# Patient Record
Sex: Female | Born: 1990
Health system: Southern US, Community
[De-identification: ages and names within clinical notes are randomized; demographics above are authoritative.]

## PROBLEM LIST (undated history)

## (undated) DIAGNOSIS — Z8489 Family history of other specified conditions: Secondary | ICD-10-CM

## (undated) HISTORY — PX: WISDOM TOOTH EXTRACTION: SHX21

---

## 2019-07-25 ENCOUNTER — Other Ambulatory Visit: Payer: Self-pay

## 2019-07-25 ENCOUNTER — Encounter (HOSPITAL_COMMUNITY): Payer: Self-pay | Admitting: Emergency Medicine

## 2019-07-25 ENCOUNTER — Observation Stay (HOSPITAL_COMMUNITY)
Admission: EM | Admit: 2019-07-25 | Discharge: 2019-07-26 | Disposition: A | Discharge status: Home / Self Care | Payer: BC Managed Care – PPO | Attending: Internal Medicine | Admitting: Internal Medicine

## 2019-07-25 ENCOUNTER — Emergency Department (HOSPITAL_COMMUNITY): Payer: BC Managed Care – PPO

## 2019-07-25 DIAGNOSIS — Z7982 Long term (current) use of aspirin: Secondary | ICD-10-CM | POA: Insufficient documentation

## 2019-07-25 DIAGNOSIS — R079 Chest pain, unspecified: Secondary | ICD-10-CM | POA: Diagnosis not present

## 2019-07-25 DIAGNOSIS — R0789 Other chest pain: Secondary | ICD-10-CM | POA: Diagnosis not present

## 2019-07-25 DIAGNOSIS — Z79899 Other long term (current) drug therapy: Secondary | ICD-10-CM | POA: Insufficient documentation

## 2019-07-25 DIAGNOSIS — R7989 Other specified abnormal findings of blood chemistry: Secondary | ICD-10-CM | POA: Diagnosis not present

## 2019-07-25 DIAGNOSIS — Z1159 Encounter for screening for other viral diseases: Secondary | ICD-10-CM | POA: Insufficient documentation

## 2019-07-25 DIAGNOSIS — R778 Other specified abnormalities of plasma proteins: Secondary | ICD-10-CM | POA: Diagnosis present

## 2019-07-25 DIAGNOSIS — N8301 Follicular cyst of right ovary: Secondary | ICD-10-CM | POA: Diagnosis not present

## 2019-07-25 DIAGNOSIS — R030 Elevated blood-pressure reading, without diagnosis of hypertension: Secondary | ICD-10-CM | POA: Insufficient documentation

## 2019-07-25 DIAGNOSIS — Z881 Allergy status to other antibiotic agents status: Secondary | ICD-10-CM | POA: Diagnosis not present

## 2019-07-25 DIAGNOSIS — Z20828 Contact with and (suspected) exposure to other viral communicable diseases: Secondary | ICD-10-CM | POA: Diagnosis not present

## 2019-07-25 HISTORY — DX: Family history of other specified conditions: Z84.89

## 2019-07-25 LAB — BASIC METABOLIC PANEL
Anion gap: 18 — ABNORMAL HIGH (ref 5–15)
BUN: 9 mg/dL (ref 6–20)
CO2: 12 mmol/L — ABNORMAL LOW (ref 22–32)
Calcium: 9.1 mg/dL (ref 8.9–10.3)
Chloride: 106 mmol/L (ref 98–111)
Creatinine, Ser: 0.91 mg/dL (ref 0.44–1.00)
GFR calc Af Amer: >60 mL/min (ref >60)
GFR calc non Af Amer: >60 mL/min (ref >60)
Glucose, Bld: 101 mg/dL — ABNORMAL HIGH (ref 70–99)
Potassium: 3.6 mmol/L (ref 3.5–5.1)
Sodium: 136 mmol/L (ref 135–145)

## 2019-07-25 LAB — CBC
HCT: 44.9 % (ref 36.0–46.0)
Hemoglobin: 14.5 g/dL (ref 12.0–15.0)
MCH: 28 pg (ref 26.0–34.0)
MCHC: 32.3 g/dL (ref 30.0–36.0)
MCV: 86.7 fL (ref 80.0–100.0)
Platelets: 419 10*3/uL — ABNORMAL HIGH (ref 150–400)
RBC: 5.18 MIL/uL — ABNORMAL HIGH (ref 3.87–5.11)
RDW: 13 % (ref 11.5–15.5)
WBC: 8.3 10*3/uL (ref 4.0–10.5)
nRBC: 0 % (ref 0.0–0.2)

## 2019-07-25 LAB — TROPONIN I (HIGH SENSITIVITY): Troponin I (High Sensitivity): 12 ng/L (ref <18)

## 2019-07-25 LAB — I-STAT BETA HCG BLOOD, ED (MC, WL, AP ONLY): I-stat hCG, quantitative: <5 m[IU]/mL (ref <5)

## 2019-07-25 MED ORDER — SODIUM CHLORIDE 0.9% FLUSH
3.0000 mL | Freq: Once | INTRAVENOUS | Status: AC
  Administered 2019-07-26: 3 mL via INTRAVENOUS

## 2019-07-25 NOTE — ED Notes (Signed)
[phone] pts Mother wants to know if she needs to come in tonight, aware pt is still in the lobby

## 2019-07-25 NOTE — ED Notes (Signed)
[phone] pts Mother wants to know if she needs to come in tonight, aware pt is still in the lobby  pts mother lives 9 hours away --please call

## 2019-07-25 NOTE — ED Triage Notes (Signed)
Pt states just about 20 minutes ago she stood up from working Production assistant, radio and felt chest pain radiating into her upper back also felt nauseous. Pt denies any cardiac hx of known family hx.

## 2019-07-25 NOTE — ED Notes (Signed)
Pt is very tearful and having a lot of anxiety in triage will recheck BP when calm.

## 2019-07-26 ENCOUNTER — Observation Stay (HOSPITAL_COMMUNITY): Payer: BC Managed Care – PPO

## 2019-07-26 ENCOUNTER — Ambulatory Visit (HOSPITAL_COMMUNITY)
Admission: EM | Disposition: A | Discharge status: Home / Self Care | Payer: Self-pay | Source: Home / Self Care | Attending: Emergency Medicine

## 2019-07-26 ENCOUNTER — Other Ambulatory Visit: Payer: Self-pay

## 2019-07-26 ENCOUNTER — Encounter (HOSPITAL_COMMUNITY): Payer: Self-pay | Admitting: Emergency Medicine

## 2019-07-26 ENCOUNTER — Observation Stay (HOSPITAL_BASED_OUTPATIENT_CLINIC_OR_DEPARTMENT_OTHER): Payer: BC Managed Care – PPO

## 2019-07-26 ENCOUNTER — Emergency Department (HOSPITAL_COMMUNITY): Payer: BC Managed Care – PPO

## 2019-07-26 DIAGNOSIS — R7989 Other specified abnormal findings of blood chemistry: Secondary | ICD-10-CM | POA: Diagnosis not present

## 2019-07-26 DIAGNOSIS — N8301 Follicular cyst of right ovary: Secondary | ICD-10-CM | POA: Diagnosis not present

## 2019-07-26 DIAGNOSIS — R079 Chest pain, unspecified: Secondary | ICD-10-CM

## 2019-07-26 DIAGNOSIS — R778 Other specified abnormalities of plasma proteins: Secondary | ICD-10-CM

## 2019-07-26 HISTORY — DX: Other specified abnormalities of plasma proteins: R77.8

## 2019-07-26 HISTORY — PX: LEFT HEART CATH AND CORONARY ANGIOGRAPHY: CATH118249

## 2019-07-26 HISTORY — PX: CARDIAC CATHETERIZATION: SHX172

## 2019-07-26 HISTORY — DX: Other specified abnormal findings of blood chemistry: R79.89

## 2019-07-26 LAB — CBC WITH DIFFERENTIAL/PLATELET
Abs Immature Granulocytes: 0.05 10*3/uL (ref 0.00–0.07)
Basophils Absolute: 0.1 10*3/uL (ref 0.0–0.1)
Basophils Relative: 0 %
Eosinophils Absolute: 0 10*3/uL (ref 0.0–0.5)
Eosinophils Relative: 0 %
HCT: 40.9 % (ref 36.0–46.0)
Hemoglobin: 13.5 g/dL (ref 12.0–15.0)
Immature Granulocytes: 0 %
Lymphocytes Relative: 15 %
Lymphs Abs: 2 10*3/uL (ref 0.7–4.0)
MCH: 28.1 pg (ref 26.0–34.0)
MCHC: 33 g/dL (ref 30.0–36.0)
MCV: 85 fL (ref 80.0–100.0)
Monocytes Absolute: 0.8 10*3/uL (ref 0.1–1.0)
Monocytes Relative: 7 %
Neutro Abs: 10 10*3/uL — ABNORMAL HIGH (ref 1.7–7.7)
Neutrophils Relative %: 78 %
Platelets: 334 10*3/uL (ref 150–400)
RBC: 4.81 MIL/uL (ref 3.87–5.11)
RDW: 13.2 % (ref 11.5–15.5)
WBC: 12.9 10*3/uL — ABNORMAL HIGH (ref 4.0–10.5)
nRBC: 0 % (ref 0.0–0.2)

## 2019-07-26 LAB — COMPREHENSIVE METABOLIC PANEL
ALT: 22 U/L (ref 0–44)
AST: 25 U/L (ref 15–41)
Albumin: 3.4 g/dL — ABNORMAL LOW (ref 3.5–5.0)
Alkaline Phosphatase: 63 U/L (ref 38–126)
Anion gap: 12 (ref 5–15)
BUN: 6 mg/dL (ref 6–20)
CO2: 18 mmol/L — ABNORMAL LOW (ref 22–32)
Calcium: 8.4 mg/dL — ABNORMAL LOW (ref 8.9–10.3)
Chloride: 105 mmol/L (ref 98–111)
Creatinine, Ser: 0.81 mg/dL (ref 0.44–1.00)
GFR calc Af Amer: >60 mL/min (ref >60)
GFR calc non Af Amer: >60 mL/min (ref >60)
Glucose, Bld: 92 mg/dL (ref 70–99)
Potassium: 3.6 mmol/L (ref 3.5–5.1)
Sodium: 135 mmol/L (ref 135–145)
Total Bilirubin: 0.4 mg/dL (ref 0.3–1.2)
Total Protein: 7 g/dL (ref 6.5–8.1)

## 2019-07-26 LAB — TROPONIN I (HIGH SENSITIVITY)
Troponin I (High Sensitivity): 734 ng/L (ref <18)
Troponin I (High Sensitivity): 1086 ng/L (ref <18)
Troponin I (High Sensitivity): 691 ng/L (ref <18)

## 2019-07-26 LAB — SARS CORONAVIRUS 2 BY RT PCR (HOSPITAL ORDER, PERFORMED IN ~~LOC~~ HOSPITAL LAB): SARS Coronavirus 2: NEGATIVE

## 2019-07-26 LAB — SEDIMENTATION RATE: Sed Rate: 20 mm/hr (ref 0–22)

## 2019-07-26 LAB — HIV ANTIBODY (ROUTINE TESTING W REFLEX): HIV Screen 4th Generation wRfx: NONREACTIVE

## 2019-07-26 LAB — C-REACTIVE PROTEIN: CRP: 1.1 mg/dL — ABNORMAL HIGH (ref <1.0)

## 2019-07-26 LAB — D-DIMER, QUANTITATIVE: D-Dimer, Quant: 0.44 ug/mL-FEU (ref 0.00–0.50)

## 2019-07-26 SURGERY — LEFT HEART CATH AND CORONARY ANGIOGRAPHY
Anesthesia: LOCAL

## 2019-07-26 MED ORDER — ACETAMINOPHEN 650 MG RE SUPP: 650.0000 mg | Freq: Four times a day (QID) | RECTAL | Status: DC | PRN

## 2019-07-26 MED ORDER — FENTANYL CITRATE (PF) 100 MCG/2ML IJ SOLN
INTRAMUSCULAR | Status: AC
  Filled 2019-07-26: qty 2

## 2019-07-26 MED ORDER — VERAPAMIL HCL 2.5 MG/ML IV SOLN
INTRAVENOUS | Status: AC
  Filled 2019-07-26: qty 2

## 2019-07-26 MED ORDER — NITROGLYCERIN 0.4 MG SL SUBL: 0.4000 mg | SUBLINGUAL_TABLET | SUBLINGUAL | Status: DC | PRN

## 2019-07-26 MED ORDER — ASPIRIN 81 MG PO TBEC: 81.0000 mg | DELAYED_RELEASE_TABLET | Freq: Every day | ORAL | 0 refills | Status: AC

## 2019-07-26 MED ORDER — METOPROLOL TARTRATE 25 MG PO TABS
25.0000 mg | ORAL_TABLET | Freq: Two times a day (BID) | ORAL | Status: DC
  Administered 2019-07-26: 25 mg via ORAL
  Filled 2019-07-26: qty 1

## 2019-07-26 MED ORDER — ACETAMINOPHEN 325 MG PO TABS: 650.0000 mg | ORAL_TABLET | Freq: Four times a day (QID) | ORAL | Status: DC | PRN

## 2019-07-26 MED ORDER — ONDANSETRON HCL 4 MG/2ML IJ SOLN: 4.0000 mg | Freq: Four times a day (QID) | INTRAMUSCULAR | Status: DC | PRN

## 2019-07-26 MED ORDER — NITROGLYCERIN 0.4 MG SL SUBL: 0.4000 mg | SUBLINGUAL_TABLET | SUBLINGUAL | 0 refills | Status: AC | PRN

## 2019-07-26 MED ORDER — ASPIRIN EC 81 MG PO TBEC
81.0000 mg | DELAYED_RELEASE_TABLET | Freq: Every day | ORAL | Status: DC
  Administered 2019-07-26: 81 mg via ORAL
  Filled 2019-07-26: qty 1

## 2019-07-26 MED ORDER — SODIUM CHLORIDE 0.9% FLUSH: 3.0000 mL | Freq: Two times a day (BID) | INTRAVENOUS | Status: DC

## 2019-07-26 MED ORDER — SODIUM CHLORIDE 0.9% FLUSH: 3.0000 mL | INTRAVENOUS | Status: DC | PRN

## 2019-07-26 MED ORDER — LIDOCAINE HCL (PF) 1 % IJ SOLN
INTRAMUSCULAR | Status: AC
  Filled 2019-07-26: qty 30

## 2019-07-26 MED ORDER — ONDANSETRON HCL 4 MG PO TABS: 4.0000 mg | ORAL_TABLET | Freq: Four times a day (QID) | ORAL | 0 refills | Status: DC | PRN

## 2019-07-26 MED ORDER — MIDAZOLAM HCL 2 MG/2ML IJ SOLN
INTRAMUSCULAR | Status: DC | PRN
  Administered 2019-07-26 (×2): 1 mg via INTRAVENOUS
  Administered 2019-07-26: 2 mg via INTRAVENOUS

## 2019-07-26 MED ORDER — HEPARIN (PORCINE) IN NACL 1000-0.9 UT/500ML-% IV SOLN
INTRAVENOUS | Status: AC
  Filled 2019-07-26: qty 500

## 2019-07-26 MED ORDER — IOHEXOL 350 MG/ML SOLN
INTRAVENOUS | Status: DC | PRN
  Administered 2019-07-26: 50 mL via INTRA_ARTERIAL

## 2019-07-26 MED ORDER — FENTANYL CITRATE (PF) 100 MCG/2ML IJ SOLN
INTRAMUSCULAR | Status: DC | PRN
  Administered 2019-07-26 (×3): 25 ug via INTRAVENOUS

## 2019-07-26 MED ORDER — MORPHINE SULFATE (PF) 2 MG/ML IV SOLN: 2.0000 mg | INTRAVENOUS | Status: DC | PRN

## 2019-07-26 MED ORDER — SODIUM CHLORIDE 0.9 % WEIGHT BASED INFUSION: 1.0000 mL/kg/h | INTRAVENOUS | Status: DC

## 2019-07-26 MED ORDER — HEPARIN SODIUM (PORCINE) 1000 UNIT/ML IJ SOLN
INTRAMUSCULAR | Status: AC
  Filled 2019-07-26: qty 1

## 2019-07-26 MED ORDER — VERAPAMIL HCL 2.5 MG/ML IV SOLN
INTRAVENOUS | Status: DC | PRN
  Administered 2019-07-26: 10 mL via INTRA_ARTERIAL

## 2019-07-26 MED ORDER — SODIUM CHLORIDE 0.9 % IV SOLN: 250.0000 mL | INTRAVENOUS | Status: DC | PRN

## 2019-07-26 MED ORDER — HEPARIN (PORCINE) 25000 UT/250ML-% IV SOLN: 1000.0000 [IU]/h | INTRAVENOUS | Status: DC

## 2019-07-26 MED ORDER — ONDANSETRON HCL 4 MG PO TABS: 4.0000 mg | ORAL_TABLET | Freq: Four times a day (QID) | ORAL | Status: DC | PRN

## 2019-07-26 MED ORDER — HEPARIN (PORCINE) IN NACL 1000-0.9 UT/500ML-% IV SOLN
INTRAVENOUS | Status: AC
  Filled 2019-07-26: qty 1000

## 2019-07-26 MED ORDER — HEPARIN BOLUS VIA INFUSION
4000.0000 [IU] | Freq: Once | INTRAVENOUS | Status: DC
  Filled 2019-07-26: qty 4000

## 2019-07-26 MED ORDER — LIDOCAINE HCL (PF) 1 % IJ SOLN
INTRAMUSCULAR | Status: DC | PRN
  Administered 2019-07-26: 2 mL
  Administered 2019-07-26: 20 mL

## 2019-07-26 MED ORDER — HEPARIN (PORCINE) IN NACL 1000-0.9 UT/500ML-% IV SOLN
INTRAVENOUS | Status: DC | PRN
  Administered 2019-07-26 (×3): 500 mL

## 2019-07-26 MED ORDER — MIDAZOLAM HCL 2 MG/2ML IJ SOLN
INTRAMUSCULAR | Status: AC
  Filled 2019-07-26: qty 2

## 2019-07-26 MED ORDER — IOHEXOL 350 MG/ML SOLN
100.0000 mL | Freq: Once | INTRAVENOUS | Status: AC | PRN
  Administered 2019-07-26: 100 mL via INTRAVENOUS

## 2019-07-26 MED ORDER — SODIUM CHLORIDE 0.9 % WEIGHT BASED INFUSION: 3.0000 mL/kg/h | INTRAVENOUS | Status: DC

## 2019-07-26 SURGICAL SUPPLY — 14 items
CATH 5FR JL3.5 JR4 ANG PIG MP (CATHETERS) ×2 IMPLANT
CLOSURE MYNX CONTROL 5F (Vascular Products) ×2 IMPLANT
DEVICE RAD COMP TR BAND LRG (VASCULAR PRODUCTS) ×2 IMPLANT
GLIDESHEATH SLEND SS 6F .021 (SHEATH) ×2 IMPLANT
GUIDEWIRE INQWIRE 1.5J.035X260 (WIRE) ×1 IMPLANT
INQWIRE 1.5J .035X260CM (WIRE) ×2
KIT HEART LEFT (KITS) ×2 IMPLANT
PACK CARDIAC CATHETERIZATION (CUSTOM PROCEDURE TRAY) ×2 IMPLANT
SHEATH PINNACLE 5F 10CM (SHEATH) ×2 IMPLANT
SHEATH PROBE COVER 6X72 (BAG) ×2 IMPLANT
TRANSDUCER W/STOPCOCK (MISCELLANEOUS) ×2 IMPLANT
TUBING CIL FLEX 10 FLL-RA (TUBING) ×2 IMPLANT
WIRE EMERALD 3MM-J .035X150CM (WIRE) ×2 IMPLANT
WIRE HI TORQ VERSACORE-J 145CM (WIRE) ×2 IMPLANT

## 2019-07-26 NOTE — Progress Notes (Signed)
Patient ID: Andrea Austin, female   DOB: 1991/12/01, 28 y.o.   MRN: [id number] Patient presented early this morning for chest pain with elevated troponin.  Cardiology has been consulted which is pending.  Patient seen and examined at bedside and plan of care discussed with her.  I have reviewed patient's medical records including this morning's H&P, current vitals, labs and medications myself.

## 2019-07-26 NOTE — H&P (View-Only) (Signed)
Cardiology Consultation:   Patient ID: Andrea Austin MRN: [id number]; DOB: 1991/03/30  Admit date: 07/25/2019 Date of Consult: 07/26/2019  Primary Care Provider: Patient, No Pcp Per Primary Cardiologist:  Taos Tapp  Primary Electrophysiologist:  None    Patient Profile:   Andrea Austin is a 28 y.o. female with no significant PMH  who is being seen today for the evaluation of  Chest pain  at the request of  Dr. Toniann FailKakrakandy.  History of Present Illness:   Andrea Austin is a healthy 28 yo.   She developed ches pressure yesterday evening around 4 PM whille working at her computer.  Pressure,  + diaphoresis.   Became very hot.    Radiated to both shoulder blades,.   Took her BP - 160 / 102   Pain partially resolved,  Then came back. Pain lasted 15 min, partially resolved,  Then the 2nd one lasted 1 hour.  Pain is worse lying down  Not worse with deep breath.  Is a week before her menstrual period  BP is typically well controlled .  Watches her salt  + fmily hx of CAD - great grandmother - CAD Both grandmothers had CVA Grandfather on months side had a CVA   Walks on occasion.  Has been taking care of her grandmother . Lots of stress  ,   Lives with boyfriend. 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 No pertinent past medical history.  History reviewed. No pertinent surgical history.   Home Medications:  Prior to Admission medications   Medication Sig Start Date End Date Taking? Authorizing Provider  cetirizine (ZYRTEC) 10 MG tablet Take 10 mg by mouth daily.   Yes [provider]  SPRINTEC 28 0.25-35 MG-MCG tablet Take 1 tablet by mouth daily. 06/07/19  Yes [provider]    Inpatient Medications: Scheduled Meds:  aspirin EC  81 mg Oral Daily   metoprolol tartrate  25 mg Oral BID   Continuous Infusions:  PRN  Meds: acetaminophen **OR** acetaminophen, ondansetron **OR** ondansetron (ZOFRAN) IV  Allergies:    Allergies  Allergen Reactions   Ceclor [Cefaclor] Other (See Comments)    shaking   Levofloxacin Other (See Comments)    Ankles and tendens feel weird    Social History:   Social History   Socioeconomic History   Marital status: Single    Spouse name: Not on file   Number of children: Not on file   Years of education: Not on file   Highest education level: Not on file  Occupational History   Not on file  Social Needs   Financial resource strain: Not on file   Food insecurity    Worry: Not on file    Inability: Not on file   Transportation needs    Medical: Not on file    Non-medical: Not on file  Tobacco Use   Smoking status: Never Smoker   Smokeless tobacco: Never Used  Substance and Sexual Activity   Alcohol use: Yes   Drug use: Not on file   Sexual activity: Not on file  Lifestyle   Physical activity    Days per week: Not on file    Minutes per session: Not on file   Stress: Not on file  Relationships   Social connections    Talks on  phone: Not on file    Gets together: Not on file    Attends religious service: Not on file    Active member of club or organization: Not on file    Attends meetings of clubs or organizations: Not on file    Relationship status: Not on file   Intimate partner violence    Fear of current or ex partner: Not on file    Emotionally abused: Not on file    Physically abused: Not on file    Forced sexual activity: Not on file  Other Topics Concern   Not on file  Social History Narrative   Not on file    Family History:    Family History  Problem Relation Age of Onset   CAD Maternal Grandmother    Stroke Maternal Grandmother    Diabetes Mellitus II Maternal Grandfather      ROS:  Please see the history of present illness.   All other ROS reviewed and negative.     Physical Exam/Data:   Vitals:    07/26/19 0830 07/26/19 0900 07/26/19 0930 07/26/19 1000  BP: (!) 144/89 (!) 148/91 (!) 156/94 (!) 162/110  Pulse: 96 91 (!) 111 (!) 124  Resp: 19 16 19  (!) 23  Temp:      TempSrc:      SpO2: 98% 97% 99% 99%  Weight:       No intake or output data in the 24 hours ending 07/26/19 1027 Last 3 Weights 07/26/2019  Weight (lbs) 180 lb  Weight (kg) 81.647 kg     There is no height or weight on file to calculate BMI.  General:  Well nourished, well developed, in no acute distress HEENT: normal Lymph: no adenopathy Neck: no JVD Endocrine:  No thryomegaly Vascular: No carotid bruits; FA pulses 2+ bilaterally without bruits  Cardiac:  normal S1, S2; RRR;  Soft systolic murmur  Lungs:  clear to auscultation bilaterally, no wheezing, rhonchi or rales  Abd: soft, nontender, no hepatomegaly  Ext: no edema Musculoskeletal:  No deformities, BUE and BLE strength normal and equal Skin: warm and dry  Neuro:  CNs 2-12 intact, no focal abnormalities noted Psych:  Normal affect   EKG:   Initial ECG showed mild ST depression in the inf. Leads  2nd ECG shows sinus tach with no ST  Abn.   Telemetry:  Telemetry was personally reviewed and demonstrates:  Sinus tach   Relevant CV Studies:   Laboratory Data:  High Sensitivity Troponin:   Recent Labs  Lab 07/25/19 1708 07/26/19 0102 07/26/19 0222  TROPONINIHS 12 734* 1,086*     Cardiac EnzymesNo results for input(s): TROPONINI in the last 168 hours. No results for input(s): TROPIPOC in the last 168 hours.  Chemistry Recent Labs  Lab 07/25/19 1708 07/26/19 0439  NA 136 135  K 3.6 3.6  CL 106 105  CO2 12* 18*  GLUCOSE 101* 92  BUN 9 6  CREATININE 0.91 0.81  CALCIUM 9.1 8.4*  GFRNONAA >60 >60  GFRAA >60 >60  ANIONGAP 18* 12    Recent Labs  Lab 07/26/19 0439  PROT 7.0  ALBUMIN 3.4*  AST 25  ALT 22  ALKPHOS 63  BILITOT 0.4   Hematology Recent Labs  Lab 07/25/19 1708 07/26/19 0439  WBC 8.3 12.9*  RBC 5.18* 4.81  HGB  14.5 13.5  HCT 44.9 40.9  MCV 86.7 85.0  MCH 28.0 28.1  MCHC 32.3 33.0  RDW 13.0 13.2  PLT 419* 334  BNPNo results for input(s): BNP, PROBNP in the last 168 hours.  DDimer  Recent Labs  Lab 07/26/19 0101  DDIMER 0.44     Radiology/Studies:  Dg Chest 2 View  Result Date: 07/25/2019 CLINICAL DATA:  Chest pain EXAM: CHEST - 2 VIEW COMPARISON:  None. FINDINGS: Lungs are clear. Heart size and pulmonary vascularity are normal. No adenopathy. No pneumothorax. No bone lesions. IMPRESSION: No edema or consolidation. Electronically Signed   By: Bretta BangWilliam  Woodruff III M.D.   On: 07/25/2019 17:24   Ct Angio Chest/abd/pel For Dissection W And/or Wo Contrast  Result Date: 07/26/2019 CLINICAL DATA:  Chest, back pain acute, aortic dissection suspected EXAM: CT ANGIOGRAPHY CHEST, ABDOMEN AND PELVIS TECHNIQUE: Multidetector CT imaging through the chest, abdomen and pelvis was performed using the standard protocol during bolus administration of intravenous contrast. Multiplanar reconstructed images and MIPs were obtained and reviewed to evaluate the vascular anatomy. CONTRAST:  100mL OMNIPAQUE IOHEXOL 350 MG/ML SOLN COMPARISON:  Chest radiograph July 25, 2019 FINDINGS: CTA CHEST FINDINGS Cardiovascular: No abnormal aortic wall thickening or features to suggest intramural hematoma. Satisfactory preferential opacification of the aorta The aorta is normal caliber. No intramural hematoma, dissection flap or other luminal abnormality of the aorta is seen. No periaortic stranding or hemorrhage. Normal branching of the great vessels. Normal heart size. No pericardial effusion. Limited evaluation of the central pulmonary arteries reveals no central or large proximal segmental pulmonary embolus. Normal pulmonary artery caliber. Mediastinum/Nodes: No enlarged mediastinal or axillary lymph nodes. Thyroid gland, trachea, and esophagus demonstrate no significant findings. Lungs/Pleura: No consolidation, features of edema,  pneumothorax, or effusion. Musculoskeletal: No chest wall abnormality. No acute or significant osseous findings. Review of the MIP images confirms the above findings. CTA ABDOMEN AND PELVIS FINDINGS VASCULAR Aorta: Normal caliber aorta without aneurysm, dissection, vasculitis or significant stenosis. Celiac: Patent without evidence of aneurysm, dissection, vasculitis or significant stenosis. SMA: Patent without evidence of aneurysm, dissection, vasculitis or significant stenosis. Renals: Singleton renal arteries bilaterally. Both renal arteries are patent without evidence of aneurysm, dissection, vasculitis, fibromuscular dysplasia or significant stenosis. IMA: Patent without evidence of aneurysm, dissection, vasculitis or significant stenosis. Inflow: Patent without evidence of aneurysm, dissection, vasculitis or significant stenosis. Veins: No obvious venous abnormality within the limitations of this arterial phase study. Review of the MIP images confirms the above findings. NON-VASCULAR Hepatobiliary: No focal liver abnormality is seen. No gallstones, gallbladder wall thickening, or biliary dilatation. Pancreas: Unremarkable. No pancreatic ductal dilatation or surrounding inflammatory changes. Spleen: Normal in size without focal abnormality. Adrenals/Urinary Tract: Adrenal glands are unremarkable. Early excretion of contrast medium partially opacifies the collecting systems. Kidneys are otherwise unremarkable, without renal calculi, focal lesion, or hydronephrosis. Bladder is unremarkable. Stomach/Bowel: Distal esophagus, stomach and duodenal sweep are unremarkable. No bowel wall thickening or dilatation. No evidence of obstruction. A normal appendix is visualized. Lymphatic: No suspicious or enlarged lymph nodes in the included lymphatic chains. Reproductive: Normal anteverted uterus. Dominant 3.2 cm follicle in the right ovary. No concerning adnexal lesions. Other: No abdominopelvic free fluid or free gas. No  bowel containing hernias. Musculoskeletal: Subchondral vacuum phenomenon involving the left ilium. Bilateral, symmetric sclerosis involving the iliac sides of both SI joints compatible with osteitis condensans ilii. No acute osseous abnormality or suspicious osseous lesion. Benign bone island in the proximal right femur. Review of the MIP images confirms the above findings. IMPRESSION: No evidence of acute aortic syndrome. No acute intrathoracic or intra-abdominal process. Electronically Signed   By: Coralie KeensPrice  DeHay M.D.  On: 07/26/2019 03:49    Assessment and Plan:   1. Acute coronary syndrome.    CP is worrisome for ACS. troponins are significant    12, 734, 1086  She is currently pain free.  Echo is being done  Im concerned about SCAD  - could also have takotsubo syndrome UDS is ordered   Discussed doing a coronary CT angio with dr. Delton SeeNelson. Our best CT scanner is currently being upgraded. I do not think the older CT scanner will help with SCAD diagnosis.  I think we should consider cardiac cath .        For questions or updates, please contact [facility] Please consult [email or web address] for contact info under     Signed, Kristeen MissPhilip Tricia [place], MD  07/26/2019 10:27 AM

## 2019-07-26 NOTE — Progress Notes (Signed)
    Cath was normal Do not have an explanation for her mild ST changes in the inferior leads and the troponin levels.  ? Spasm. Will DC on ASA 81 mg a day  NTG 0.4 mg SL PRN chest pain  Will Dc the metoprolol I started her on this am  Will have her follow up with [place] in 3-4 weeks. ( me or APP ) and I will see her in 2 months .  She knows the restrictions for taking care of her cath sites.     Mertie Moores, MD  07/26/2019 3:27 PM    [facility] [address],  [place], [place]  [address] Pager [phone] Phone: [phone]; Fax: [phone]

## 2019-07-26 NOTE — ED Notes (Signed)
Pt transported to cath lab.  

## 2019-07-26 NOTE — Consult Note (Signed)
Cardiology Consultation:   Patient ID: Andrea Austin MRN: [id number]; DOB: 1991/03/30  Admit date: 07/25/2019 Date of Consult: 07/26/2019  Primary Care Provider: Patient, No Pcp Per Primary Cardiologist:  Haelie Clapp  Primary Electrophysiologist:  None    Patient Profile:   Andrea Austin is a 28 y.o. female with no significant PMH  who is being seen today for the evaluation of  Chest pain  at the request of  Dr. Toniann FailKakrakandy.  History of Present Illness:   Ms. Andrea Austin is a healthy 28 yo.   She developed ches pressure yesterday evening around 4 PM whille working at her computer.  Pressure,  + diaphoresis.   Became very hot.    Radiated to both shoulder blades,.   Took her BP - 160 / 102   Pain partially resolved,  Then came back. Pain lasted 15 min, partially resolved,  Then the 2nd one lasted 1 hour.  Pain is worse lying down  Not worse with deep breath.  Is a week before her menstrual period  BP is typically well controlled .  Watches her salt  + fmily hx of CAD - great grandmother - CAD Both grandmothers had CVA Grandfather on months side had a CVA   Walks on occasion.  Has been taking care of her grandmother . Lots of stress  ,   Lives with boyfriend. 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 No pertinent past medical history.  History reviewed. No pertinent surgical history.   Home Medications:  Prior to Admission medications   Medication Sig Start Date End Date Taking? Authorizing Provider  cetirizine (ZYRTEC) 10 MG tablet Take 10 mg by mouth daily.   Yes [provider]  SPRINTEC 28 0.25-35 MG-MCG tablet Take 1 tablet by mouth daily. 06/07/19  Yes [provider]    Inpatient Medications: Scheduled Meds:  aspirin EC  81 mg Oral Daily   metoprolol tartrate  25 mg Oral BID   Continuous Infusions:  PRN  Meds: acetaminophen **OR** acetaminophen, ondansetron **OR** ondansetron (ZOFRAN) IV  Allergies:    Allergies  Allergen Reactions   Ceclor [Cefaclor] Other (See Comments)    shaking   Levofloxacin Other (See Comments)    Ankles and tendens feel weird    Social History:   Social History   Socioeconomic History   Marital status: Single    Spouse name: Not on file   Number of children: Not on file   Years of education: Not on file   Highest education level: Not on file  Occupational History   Not on file  Social Needs   Financial resource strain: Not on file   Food insecurity    Worry: Not on file    Inability: Not on file   Transportation needs    Medical: Not on file    Non-medical: Not on file  Tobacco Use   Smoking status: Never Smoker   Smokeless tobacco: Never Used  Substance and Sexual Activity   Alcohol use: Yes   Drug use: Not on file   Sexual activity: Not on file  Lifestyle   Physical activity    Days per week: Not on file    Minutes per session: Not on file   Stress: Not on file  Relationships   Social connections    Talks on  phone: Not on file    Gets together: Not on file    Attends religious service: Not on file    Active member of club or organization: Not on file    Attends meetings of clubs or organizations: Not on file    Relationship status: Not on file   Intimate partner violence    Fear of current or ex partner: Not on file    Emotionally abused: Not on file    Physically abused: Not on file    Forced sexual activity: Not on file  Other Topics Concern   Not on file  Social History Narrative   Not on file    Family History:    Family History  Problem Relation Age of Onset   CAD Maternal Grandmother    Stroke Maternal Grandmother    Diabetes Mellitus II Maternal Grandfather      ROS:  Please see the history of present illness.   All other ROS reviewed and negative.     Physical Exam/Data:   Vitals:    07/26/19 0830 07/26/19 0900 07/26/19 0930 07/26/19 1000  BP: (!) 144/89 (!) 148/91 (!) 156/94 (!) 162/110  Pulse: 96 91 (!) 111 (!) 124  Resp: 19 16 19  (!) 23  Temp:      TempSrc:      SpO2: 98% 97% 99% 99%  Weight:       No intake or output data in the 24 hours ending 07/26/19 1027 Last 3 Weights 07/26/2019  Weight (lbs) 180 lb  Weight (kg) 81.647 kg     There is no height or weight on file to calculate BMI.  General:  Well nourished, well developed, in no acute distress HEENT: normal Lymph: no adenopathy Neck: no JVD Endocrine:  No thryomegaly Vascular: No carotid bruits; FA pulses 2+ bilaterally without bruits  Cardiac:  normal S1, S2; RRR;  Soft systolic murmur  Lungs:  clear to auscultation bilaterally, no wheezing, rhonchi or rales  Abd: soft, nontender, no hepatomegaly  Ext: no edema Musculoskeletal:  No deformities, BUE and BLE strength normal and equal Skin: warm and dry  Neuro:  CNs 2-12 intact, no focal abnormalities noted Psych:  Normal affect   EKG:   Initial ECG showed mild ST depression in the inf. Leads  2nd ECG shows sinus tach with no ST  Abn.   Telemetry:  Telemetry was personally reviewed and demonstrates:  Sinus tach   Relevant CV Studies:   Laboratory Data:  High Sensitivity Troponin:   Recent Labs  Lab 07/25/19 1708 07/26/19 0102 07/26/19 0222  TROPONINIHS 12 734* 1,086*     Cardiac EnzymesNo results for input(s): TROPONINI in the last 168 hours. No results for input(s): TROPIPOC in the last 168 hours.  Chemistry Recent Labs  Lab 07/25/19 1708 07/26/19 0439  NA 136 135  K 3.6 3.6  CL 106 105  CO2 12* 18*  GLUCOSE 101* 92  BUN 9 6  CREATININE 0.91 0.81  CALCIUM 9.1 8.4*  GFRNONAA >60 >60  GFRAA >60 >60  ANIONGAP 18* 12    Recent Labs  Lab 07/26/19 0439  PROT 7.0  ALBUMIN 3.4*  AST 25  ALT 22  ALKPHOS 63  BILITOT 0.4   Hematology Recent Labs  Lab 07/25/19 1708 07/26/19 0439  WBC 8.3 12.9*  RBC 5.18* 4.81  HGB  14.5 13.5  HCT 44.9 40.9  MCV 86.7 85.0  MCH 28.0 28.1  MCHC 32.3 33.0  RDW 13.0 13.2  PLT 419* 334  BNPNo results for input(s): BNP, PROBNP in the last 168 hours.  °DDimer  °Recent Labs  °Lab 07/26/19 °0101  °DDIMER 0.44  ° ° ° °Radiology/Studies:  °Dg Chest 2 View ° °Result Date: 07/25/2019 °CLINICAL DATA:  Chest pain EXAM: CHEST - 2 VIEW COMPARISON:  None. FINDINGS: Lungs are clear. Heart size and pulmonary vascularity are normal. No adenopathy. No pneumothorax. No bone lesions. IMPRESSION: No edema or consolidation. Electronically Signed   By: William  Woodruff III M.D.   On: 07/25/2019 17:24  ° °Ct Angio Chest/abd/pel For Dissection W And/or Wo Contrast ° °Result Date: 07/26/2019 °CLINICAL DATA:  Chest, back pain acute, aortic dissection suspected EXAM: CT ANGIOGRAPHY CHEST, ABDOMEN AND PELVIS TECHNIQUE: Multidetector CT imaging through the chest, abdomen and pelvis was performed using the standard protocol during bolus administration of intravenous contrast. Multiplanar reconstructed images and MIPs were obtained and reviewed to evaluate the vascular anatomy. CONTRAST:  100mL OMNIPAQUE IOHEXOL 350 MG/ML SOLN COMPARISON:  Chest radiograph July 25, 2019 FINDINGS: CTA CHEST FINDINGS Cardiovascular: No abnormal aortic wall thickening or features to suggest intramural hematoma. Satisfactory preferential opacification of the aorta The aorta is normal caliber. No intramural hematoma, dissection flap or other luminal abnormality of the aorta is seen. No periaortic stranding or hemorrhage. Normal branching of the great vessels. Normal heart size. No pericardial effusion. Limited evaluation of the central pulmonary arteries reveals no central or large proximal segmental pulmonary embolus. Normal pulmonary artery caliber. Mediastinum/Nodes: No enlarged mediastinal or axillary lymph nodes. Thyroid gland, trachea, and esophagus demonstrate no significant findings. Lungs/Pleura: No consolidation, features of edema,  pneumothorax, or effusion. Musculoskeletal: No chest wall abnormality. No acute or significant osseous findings. Review of the MIP images confirms the above findings. CTA ABDOMEN AND PELVIS FINDINGS VASCULAR Aorta: Normal caliber aorta without aneurysm, dissection, vasculitis or significant stenosis. Celiac: Patent without evidence of aneurysm, dissection, vasculitis or significant stenosis. SMA: Patent without evidence of aneurysm, dissection, vasculitis or significant stenosis. Renals: Singleton renal arteries bilaterally. Both renal arteries are patent without evidence of aneurysm, dissection, vasculitis, fibromuscular dysplasia or significant stenosis. IMA: Patent without evidence of aneurysm, dissection, vasculitis or significant stenosis. Inflow: Patent without evidence of aneurysm, dissection, vasculitis or significant stenosis. Veins: No obvious venous abnormality within the limitations of this arterial phase study. Review of the MIP images confirms the above findings. NON-VASCULAR Hepatobiliary: No focal liver abnormality is seen. No gallstones, gallbladder wall thickening, or biliary dilatation. Pancreas: Unremarkable. No pancreatic ductal dilatation or surrounding inflammatory changes. Spleen: Normal in size without focal abnormality. Adrenals/Urinary Tract: Adrenal glands are unremarkable. Early excretion of contrast medium partially opacifies the collecting systems. Kidneys are otherwise unremarkable, without renal calculi, focal lesion, or hydronephrosis. Bladder is unremarkable. Stomach/Bowel: Distal esophagus, stomach and duodenal sweep are unremarkable. No bowel wall thickening or dilatation. No evidence of obstruction. A normal appendix is visualized. Lymphatic: No suspicious or enlarged lymph nodes in the included lymphatic chains. Reproductive: Normal anteverted uterus. Dominant 3.2 cm follicle in the right ovary. No concerning adnexal lesions. Other: No abdominopelvic free fluid or free gas. No  bowel containing hernias. Musculoskeletal: Subchondral vacuum phenomenon involving the left ilium. Bilateral, symmetric sclerosis involving the iliac sides of both SI joints compatible with osteitis condensans ilii. No acute osseous abnormality or suspicious osseous lesion. Benign bone island in the proximal right femur. Review of the MIP images confirms the above findings. IMPRESSION: No evidence of acute aortic syndrome. No acute intrathoracic or intra-abdominal process. Electronically Signed   By: Price  DeHay M.D.     On: 07/26/2019 03:49    Assessment and Plan:   1. Acute coronary syndrome.    CP is worrisome for ACS. troponins are significant    12, 734, 1086  She is currently pain free.  Echo is being done  Im concerned about SCAD  - could also have takotsubo syndrome UDS is ordered   Discussed doing a coronary CT angio with dr. Delton SeeNelson. Our best CT scanner is currently being upgraded. I do not think the older CT scanner will help with SCAD diagnosis.  I think we should consider cardiac cath .        For questions or updates, please contact [facility] Please consult [email or web address] for contact info under     Signed, Kristeen MissPhilip Markees Carns, MD  07/26/2019 10:27 AM

## 2019-07-26 NOTE — H&P (Signed)
History and Physical    Andrea Austin VHQ:[id number] DOB: 1991-06-27 DOA: 07/25/2019  PCP: Patient, No Pcp Per  Patient coming from: Home.  Chief Complaint: Chest pain.  HPI: Andrea Austin is a 28 y.o. female with no significant past medical history presents to the ER after patient started having chest pain.  Patient states that she was working on the computer last evening around 4 PM when she start developing pressure-like chest pain radiating to both shoulder blades when patient became diaphoretic.  The chest pain lasted for 15 minutes.  And resolved for few minutes and came back again for another 5 minutes when patient took some aspirin.  Had some nausea denies any vomiting abdominal pain shortness of breath productive cough fever or chills.  Patient states her chest pain worsened on lying down.  Patient came to the ER given the symptoms.  ED Course: In the ER patient chest pain had resolved.  EKG shows sinus rhythm nonspecific changes.  Initial troponins were negative and subsequent ones came 734 and 1086.  CT dissection study was done which was showing nothing acute.  On-call cardiology was consulted.  Patient admitted for further work-up.  Cardiology recommended 2D echo.  Review of Systems: As per HPI, rest all negative.   History reviewed. No pertinent past medical history.  History reviewed. No pertinent surgical history.   reports that she has never smoked. She has never used smokeless tobacco. She reports current alcohol use. No history on file for drug.  No Known Allergies  Family History  Problem Relation Age of Onset  . CAD Maternal Grandmother   . Stroke Maternal Grandmother   . Diabetes Mellitus II Maternal Grandfather     Prior to Admission medications   Not on File    Physical Exam: Constitutional: Moderately built and nourished. Vitals:   07/26/19 0059 07/26/19 0215 07/26/19 0228 07/26/19 0345  BP:      Pulse: 90 (!) 102  92  Resp: 19 20  18    Temp:      TempSrc:      SpO2: 100% 100%  99%  Weight:   81.6 kg    Eyes: Anicteric no pallor. ENMT: No discharge from the ears eyes nose or mouth. Neck: No mass felt.  No neck rigidity. Respiratory: No rhonchi or crepitations. Cardiovascular: S1-S2 heard. Abdomen: Soft nontender bowel sounds present. Musculoskeletal: No edema. Skin: No rash. Neurologic: Alert awake oriented to time place and person.  Moves all extremities. Psychiatric: Appears normal per normal affect.   Labs on Admission: I have personally reviewed following labs and imaging studies  CBC: Recent Labs  Lab 07/25/19 1708  WBC 8.3  HGB 14.5  HCT 44.9  MCV 86.7  PLT 419*   Basic Metabolic Panel: Recent Labs  Lab 07/25/19 1708  NA 136  K 3.6  CL 106  CO2 12*  GLUCOSE 101*  BUN 9  CREATININE 0.91  CALCIUM 9.1   GFR: CrCl cannot be calculated (Unknown ideal weight.). Liver Function Tests: No results for input(s): AST, ALT, ALKPHOS, BILITOT, PROT, ALBUMIN in the last 168 hours. No results for input(s): LIPASE, AMYLASE in the last 168 hours. No results for input(s): AMMONIA in the last 168 hours. Coagulation Profile: No results for input(s): INR, PROTIME in the last 168 hours. Cardiac Enzymes: No results for input(s): CKTOTAL, CKMB, CKMBINDEX, TROPONINI in the last 168 hours. BNP (last 3 results) No results for input(s): PROBNP in the last 8760 hours. HbA1C: No results for  input(s): HGBA1C in the last 72 hours. CBG: No results for input(s): GLUCAP in the last 168 hours. Lipid Profile: No results for input(s): CHOL, HDL, LDLCALC, TRIG, CHOLHDL, LDLDIRECT in the last 72 hours. Thyroid Function Tests: No results for input(s): TSH, T4TOTAL, FREET4, T3FREE, THYROIDAB in the last 72 hours. Anemia Panel: No results for input(s): VITAMINB12, FOLATE, FERRITIN, TIBC, IRON, RETICCTPCT in the last 72 hours. Urine analysis: No results found for: COLORURINE, APPEARANCEUR, LABSPEC, PHURINE, GLUCOSEU,  HGBUR, BILIRUBINUR, KETONESUR, PROTEINUR, UROBILINOGEN, NITRITE, LEUKOCYTESUR Sepsis Labs: @LABRCNTIP (procalcitonin:4,lacticidven:4) )No results found for this or any previous visit (from the past 240 hour(s)).   Radiological Exams on Admission: Dg Chest 2 View  Result Date: 07/25/2019 CLINICAL DATA:  Chest pain EXAM: CHEST - 2 VIEW COMPARISON:  None. FINDINGS: Lungs are clear. Heart size and pulmonary vascularity are normal. No adenopathy. No pneumothorax. No bone lesions. IMPRESSION: No edema or consolidation. Electronically Signed   By: Lowella Grip III M.D.   On: 07/25/2019 17:24   Ct Angio Chest/abd/pel For Dissection W And/or Wo Contrast  Result Date: 07/26/2019 CLINICAL DATA:  Chest, back pain acute, aortic dissection suspected EXAM: CT ANGIOGRAPHY CHEST, ABDOMEN AND PELVIS TECHNIQUE: Multidetector CT imaging through the chest, abdomen and pelvis was performed using the standard protocol during bolus administration of intravenous contrast. Multiplanar reconstructed images and MIPs were obtained and reviewed to evaluate the vascular anatomy. CONTRAST:  186mL OMNIPAQUE IOHEXOL 350 MG/ML SOLN COMPARISON:  Chest radiograph July 25, 2019 FINDINGS: CTA CHEST FINDINGS Cardiovascular: No abnormal aortic wall thickening or features to suggest intramural hematoma. Satisfactory preferential opacification of the aorta The aorta is normal caliber. No intramural hematoma, dissection flap or other luminal abnormality of the aorta is seen. No periaortic stranding or hemorrhage. Normal branching of the great vessels. Normal heart size. No pericardial effusion. Limited evaluation of the central pulmonary arteries reveals no central or large proximal segmental pulmonary embolus. Normal pulmonary artery caliber. Mediastinum/Nodes: No enlarged mediastinal or axillary lymph nodes. Thyroid gland, trachea, and esophagus demonstrate no significant findings. Lungs/Pleura: No consolidation, features of edema,  pneumothorax, or effusion. Musculoskeletal: No chest wall abnormality. No acute or significant osseous findings. Review of the MIP images confirms the above findings. CTA ABDOMEN AND PELVIS FINDINGS VASCULAR Aorta: Normal caliber aorta without aneurysm, dissection, vasculitis or significant stenosis. Celiac: Patent without evidence of aneurysm, dissection, vasculitis or significant stenosis. SMA: Patent without evidence of aneurysm, dissection, vasculitis or significant stenosis. Renals: [place] renal arteries bilaterally. Both renal arteries are patent without evidence of aneurysm, dissection, vasculitis, fibromuscular dysplasia or significant stenosis. IMA: Patent without evidence of aneurysm, dissection, vasculitis or significant stenosis. Inflow: Patent without evidence of aneurysm, dissection, vasculitis or significant stenosis. Veins: No obvious venous abnormality within the limitations of this arterial phase study. Review of the MIP images confirms the above findings. NON-VASCULAR Hepatobiliary: No focal liver abnormality is seen. No gallstones, gallbladder wall thickening, or biliary dilatation. Pancreas: Unremarkable. No pancreatic ductal dilatation or surrounding inflammatory changes. Spleen: Normal in size without focal abnormality. Adrenals/Urinary Tract: Adrenal glands are unremarkable. Early excretion of contrast medium partially opacifies the collecting systems. Kidneys are otherwise unremarkable, without renal calculi, focal lesion, or hydronephrosis. Bladder is unremarkable. Stomach/Bowel: Distal esophagus, stomach and duodenal sweep are unremarkable. No bowel wall thickening or dilatation. No evidence of obstruction. A normal appendix is visualized. Lymphatic: No suspicious or enlarged lymph nodes in the included lymphatic chains. Reproductive: Normal anteverted uterus. Dominant 3.2 cm follicle in the right ovary. No concerning adnexal lesions. Other: No abdominopelvic  free fluid or free gas. No  bowel containing hernias. Musculoskeletal: Subchondral vacuum phenomenon involving the left ilium. Bilateral, symmetric sclerosis involving the iliac sides of both SI joints compatible with osteitis condensans ilii. No acute osseous abnormality or suspicious osseous lesion. Benign bone island in the proximal right femur. Review of the MIP images confirms the above findings. IMPRESSION: No evidence of acute aortic syndrome. No acute intrathoracic or intra-abdominal process. Electronically Signed   By: Kreg ShropshirePrice  DeHay M.D.   On: 07/26/2019 03:49    EKG: Independently reviewed.  Normal sinus rhythm.  Assessment/Plan Principal Problem:   Chest pain Active Problems:   Elevated troponin    1. Chest pain with elevated troponin -presently chest pain-free will check 2D echo patient on aspirin.  We will keep patient n.p.o. in anticipation of possible procedure.  Cardiology notified.  Check sed rate and CRP levels. 2. Elevated blood pressure -closely follow blood pressure trends.   DVT prophylaxis: SCDs for now. Code Status: Full code. Family Communication: Discussed with patient. Disposition Plan: Home. Consults called: Cardiology. Admission status: Observation.   Eduard ClosArshad N Thena Devora MD [facility] Pager [phone].  If 7PM-7AM, please contact night-coverage [email or web address] Password [email or web address]  07/26/2019, 4:27 AM

## 2019-07-26 NOTE — Progress Notes (Signed)
ANTICOAGULATION CONSULT NOTE - Initial Consult  Pharmacy Consult for Heparin  Indication: chest pain/ACS  No Known Allergies  Patient Measurements: Weight: 180 lb (81.6 kg) Vital Signs: Temp: 99.2 F (37.3 C) (08/18 0027) Temp Source: Oral (08/18 0027) BP: 154/97 (08/18 0045) Pulse Rate: 102 (08/18 0215)  Labs: Recent Labs    07/25/19 1708 07/26/19 0102  HGB 14.5  --   HCT 44.9  --   PLT 419*  --   CREATININE 0.91  --   TROPONINIHS 12 734*    Assessment: Heparin for CP/midly elevated troponin. CBC/renal function good. PTA med is birth control.   Goal of Therapy:  Heparin level 0.3-0.7 units/ml Monitor platelets by anticoagulation protocol: Yes   Plan:  Heparin 4000 units BOLUS Start heparin drip at 1000 units/hr 1130 HL Daily CBC/HL Monitor for bleeding   Narda Bonds 07/26/2019,2:29 AM

## 2019-07-26 NOTE — ED Notes (Signed)
Cards at bedside

## 2019-07-26 NOTE — Interval H&P Note (Signed)
History and Physical Interval Note:  07/26/2019 12:43 PM  Andrea Austin  has presented today for surgery, with the diagnosis of chest pain.  The various methods of treatment have been discussed with the patient and family. After consideration of risks, benefits and other options for treatment, the patient has consented to  Procedure(s): LEFT HEART CATH AND CORONARY ANGIOGRAPHY (N/A) as a surgical intervention.  The patient's history has been reviewed, patient examined, no change in status, stable for surgery.  I have reviewed the patient's chart and labs.  Questions were answered to the patient's satisfaction.   Cath Lab Visit (complete for each Cath Lab visit)  Clinical Evaluation Leading to the Procedure:   ACS: Yes.    Non-ACS:    Anginal Classification: CCS IV  Anti-ischemic medical therapy: No Therapy  Non-Invasive Test Results: No non-invasive testing performed  Prior CABG: No previous CABG        Collier [facility] 07/26/2019 12:43 PM

## 2019-07-26 NOTE — ED Provider Notes (Addendum)
[facility] EMERGENCY DEPARTMENT Provider Note   CSN: [id number] Arrival date & time: 07/25/19  1657     History   Chief Complaint Chief Complaint  Patient presents with  . Chest Pain    HPI Andrea Austin is a 28 y.o. female.     Patient presents to the ED with a chief complaint of chest pain.  She states that this afternoon she began having some chest pain and nausea.  She states that she did feel very anxious.  She reports recent travel to [place] and to contact denies any fever, chills, or cough.  Denies any exposure to coronavirus.  She does use OCPs.  Denies any history of PE or DVT.  Denies any symptoms now.  No treatments prior to arrival.  The history is provided by the patient. No language interpreter was used.    History reviewed. No pertinent past medical history.  There are no active problems to display for this patient.   History reviewed. No pertinent surgical history.   OB History   No obstetric history on file.      Home Medications    Prior to Admission medications   Not on File    Family History No family history on file.  Social History Social History   Tobacco Use  . Smoking status: Not on file  Substance Use Topics  . Alcohol use: Not on file  . Drug use: Not on file     Allergies   Patient has no known allergies.   Review of Systems Review of Systems  All other systems reviewed and are negative.    Physical Exam Updated Vital Signs BP (!) 154/97   Pulse 90   Temp 99.2 F (37.3 C) (Oral)   Resp 19   LMP 07/07/2019 (Approximate)   SpO2 100%   Physical Exam Vitals signs and nursing note reviewed.  Constitutional:      General: She is not in acute distress.    Appearance: She is well-developed.  HENT:     Head: Normocephalic and atraumatic.  Eyes:     Conjunctiva/sclera: Conjunctivae normal.  Neck:     Musculoskeletal: Neck supple.  Cardiovascular:     Rate and Rhythm: Normal rate and  regular rhythm.     Heart sounds: No murmur.  Pulmonary:     Effort: Pulmonary effort is normal. No respiratory distress.     Breath sounds: Normal breath sounds.  Abdominal:     Palpations: Abdomen is soft.     Tenderness: There is no abdominal tenderness.  Musculoskeletal: Normal range of motion.  Skin:    General: Skin is warm and dry.  Neurological:     Mental Status: She is alert and oriented to person, place, and time.  Psychiatric:        Mood and Affect: Mood normal.        Behavior: Behavior normal.      ED Treatments / Results  Labs (all labs ordered are listed, but only abnormal results are displayed) Labs Reviewed  BASIC METABOLIC PANEL - Abnormal; Notable for the following components:      Result Value   CO2 12 (*)    Glucose, Bld 101 (*)    Anion gap 18 (*)    All other components within normal limits  CBC - Abnormal; Notable for the following components:   RBC 5.18 (*)    Platelets 419 (*)    All other components within normal limits  D-DIMER,  QUANTITATIVE (NOT AT [facility])  I-STAT BETA HCG BLOOD, ED ([facility], [facility], [facility] ONLY)  TROPONIN I (HIGH SENSITIVITY)  TROPONIN I (HIGH SENSITIVITY)  TROPONIN I (HIGH SENSITIVITY)    EKG EKG Interpretation  Date/Time:  Monday July 25 2019 17:07:14 EDT Ventricular Rate:  113 PR Interval:  126 QRS Duration: 86 QT Interval:  316 QTC Calculation: 433 R Axis:   46 Text Interpretation:  Sinus tachycardia Possible Left atrial enlargement Borderline ECG No old tracing to compare Confirmed by Marily MemosMesner, Jason [phone]([id number]) on 07/25/2019 11:35:28 PM   Radiology Dg Chest 2 View  Result Date: 07/25/2019 CLINICAL DATA:  Chest pain EXAM: CHEST - 2 VIEW COMPARISON:  None. FINDINGS: Lungs are clear. Heart size and pulmonary vascularity are normal. No adenopathy. No pneumothorax. No bone lesions. IMPRESSION: No edema or consolidation. Electronically Signed   By: Bretta BangWilliam  Woodruff III M.D.   On: 07/25/2019 17:24    Procedures Procedures  (including critical care time) CRITICAL CARE Performed by: Roxy Horsemanobert Muskan Bolla  Uptrending troponin 12->734->1086 Total critical care time: 45 minutes  Critical care time was exclusive of separately billable procedures and treating other patients.  Critical care was necessary to treat or prevent imminent or life-threatening deterioration.  Critical care was time spent personally by me on the following activities: development of treatment plan with patient and/or surrogate as well as nursing, discussions with consultants, evaluation of patient's response to treatment, examination of patient, obtaining history from patient or surrogate, ordering and performing treatments and interventions, ordering and review of laboratory studies, ordering and review of radiographic studies, pulse oximetry and re-evaluation of patient's condition.  Medications Ordered in ED Medications  sodium chloride flush (NS) 0.9 % injection 3 mL (has no administration in time range)     Initial Impression / Assessment and Plan / ED Course  I have reviewed the triage vital signs and the nursing notes.  Pertinent labs & imaging results that were available during my care of the patient were reviewed by me and considered in my medical decision making (see chart for details).        Patient with chest pain, nausea, and anxiety that started this afternoon.  No longer having symptoms now.  Screening labs are ok.  She does have some risk for PE due to OCPs and travel.  Will check d-dimer.  D-dimer is negative.  Repeat troponin has elevated significantly.  I discussed case with cardiology, who recommends medicine admission for echocardiogram in the morning.  States no heparin, as patient is not having any pain.  CT dissection study negative.  Will consult medicine for admission.  Patient remains pain-free.  Appreciate Dr. Toniann FailKakrakandy for admission.  Final Clinical Impressions(s) / ED Diagnoses   Final diagnoses:   Nonspecific chest pain  Elevated troponin    ED Discharge Orders    None       Roxy HorsemanBrowning, Orange Hilligoss, PA-C 07/26/19 0409    Roxy HorsemanBrowning, Vernel Langenderfer, PA-C 07/26/19 0409    Mesner, Barbara CowerJason, MD 07/27/19 [phone]

## 2019-07-26 NOTE — Discharge Summary (Signed)
Physician Discharge Summary  Andrea Austin ZOX:[id number] DOB: 1991-06-16 DOA: 07/25/2019  PCP: Patient, No Pcp Per  Admit date: 07/25/2019 Discharge date: 07/26/2019  Admitted From: Home Disposition: Home  Recommendations for Outpatient Follow-up:  1. Follow up with PCP in 1 week with repeat CBC/BMP 2. Outpatient follow-up with cardiology 3. Follow up in ED if symptoms worsen or new appear   Home Health: No Equipment/Devices: None  Discharge Condition: Stable CODE STATUS: Full Diet recommendation: Heart healthy  Brief/Interim Summary: 28 year old female with no past medical history presented with chest pain.  Initial troponins were negative but subsequent ones were 734 and 1086.  CT dissection study was done which showed nothing acute.  Cardiology was consulted.  Cardiac cath was normal.  Cardiology has cleared the patient for discharge.  She will be discharged home on oral aspirin 81 mg daily and as needed nitroglycerin with outpatient follow-up with cardiology.  Discharge Diagnoses:   Chest pain with positive troponins -Questionable cause. -Status post cardiac catheterization done today which was normal.  Echo showed EF of 60 to 65% -CT angios chest/abdomen and pelvis showed no evidence of dissection or any acute abnormality. -Currently chest pain-free.  Cardiology has cleared the patient for discharge on aspirin 81 mg daily and as needed nitroglycerin with outpatient follow-up with cardiology as an outpatient. -COVID-19 rapid test was negative.  Elevated blood pressure -Blood pressure still elevated but this can be followed up as an outpatient by PCP and/or cardiology.   Discharge Instructions  Discharge Instructions    Ambulatory referral to Cardiology   Complete by: As directed    Diet - low sodium heart healthy   Complete by: As directed    Increase activity slowly   Complete by: As directed      Allergies as of 07/26/2019      Reactions   Ceclor [cefaclor]  Other (See Comments)   shaking   Levofloxacin Other (See Comments)   Ankles and tendens feel weird      Medication List    TAKE these medications   aspirin 81 MG EC tablet Take 1 tablet (81 mg total) by mouth daily. Start taking on: July 27, 2019   cetirizine 10 MG tablet Commonly known as: ZYRTEC Take 10 mg by mouth daily.   nitroGLYCERIN 0.4 MG SL tablet Commonly known as: NITROSTAT Place 1 tablet (0.4 mg total) under the tongue every 5 (five) minutes as needed for chest pain.   ondansetron 4 MG tablet Commonly known as: ZOFRAN Take 1 tablet (4 mg total) by mouth every 6 (six) hours as needed for nausea.   Sprintec 28 0.25-35 MG-MCG tablet Generic drug: norgestimate-ethinyl estradiol Take 1 tablet by mouth daily.      Follow-up Information    PCP. Schedule an appointment as soon as possible for a visit in 1 week(s).          Allergies  Allergen Reactions  . Ceclor [Cefaclor] Other (See Comments)    shaking  . Levofloxacin Other (See Comments)    Ankles and tendens feel weird    Consultations:  Cardiology   Procedures/Studies: Dg Chest 2 View  Result Date: 07/25/2019 CLINICAL DATA:  Chest pain EXAM: CHEST - 2 VIEW COMPARISON:  None. FINDINGS: Lungs are clear. Heart size and pulmonary vascularity are normal. No adenopathy. No pneumothorax. No bone lesions. IMPRESSION: No edema or consolidation. Electronically Signed   By: Bretta BangWilliam  Woodruff III M.D.   On: 07/25/2019 17:24   Ct Angio Chest/abd/pel For Dissection W  And/or Wo Contrast  Result Date: 07/26/2019 CLINICAL DATA:  Chest, back pain acute, aortic dissection suspected EXAM: CT ANGIOGRAPHY CHEST, ABDOMEN AND PELVIS TECHNIQUE: Multidetector CT imaging through the chest, abdomen and pelvis was performed using the standard protocol during bolus administration of intravenous contrast. Multiplanar reconstructed images and MIPs were obtained and reviewed to evaluate the vascular anatomy. CONTRAST:   OMNIPAQUE IOHEXOL 350 MG/ML SOLN COMPARISON:  Chest radiograph July 25, 2019 FINDINGS: CTA CHEST FINDINGS Cardiovascular: No abnormal aortic wall thickening or features to suggest intramural hematoma. Satisfactory preferential opacification of the aorta The aorta is normal caliber. No intramural hematoma, dissection flap or other luminal abnormality of the aorta is seen. No periaortic stranding or hemorrhage. Normal branching of the great vessels. Normal heart size. No pericardial effusion. Limited evaluation of the central pulmonary arteries reveals no central or large proximal segmental pulmonary embolus. Normal pulmonary artery caliber. Mediastinum/Nodes: No enlarged mediastinal or axillary lymph nodes. Thyroid gland, trachea, and esophagus demonstrate no significant findings. Lungs/Pleura: No consolidation, features of edema, pneumothorax, or effusion. Musculoskeletal: No chest wall abnormality. No acute or significant osseous findings. Review of the MIP images confirms the above findings. CTA ABDOMEN AND PELVIS FINDINGS VASCULAR Aorta: Normal caliber aorta without aneurysm, dissection, vasculitis or significant stenosis. Celiac: Patent without evidence of aneurysm, dissection, vasculitis or significant stenosis. SMA: Patent without evidence of aneurysm, dissection, vasculitis or significant stenosis. Renals: [place] renal arteries bilaterally. Both renal arteries are patent without evidence of aneurysm, dissection, vasculitis, fibromuscular dysplasia or significant stenosis. IMA: Patent without evidence of aneurysm, dissection, vasculitis or significant stenosis. Inflow: Patent without evidence of aneurysm, dissection, vasculitis or significant stenosis. Veins: No obvious venous abnormality within the limitations of this arterial phase study. Review of the MIP images confirms the above findings. NON-VASCULAR Hepatobiliary: No focal liver abnormality is seen. No gallstones, gallbladder wall thickening, or  biliary dilatation. Pancreas: Unremarkable. No pancreatic ductal dilatation or surrounding inflammatory changes. Spleen: Normal in size without focal abnormality. Adrenals/Urinary Tract: Adrenal glands are unremarkable. Early excretion of contrast medium partially opacifies the collecting systems. Kidneys are otherwise unremarkable, without renal calculi, focal lesion, or hydronephrosis. Bladder is unremarkable. Stomach/Bowel: Distal esophagus, stomach and duodenal sweep are unremarkable. No bowel wall thickening or dilatation. No evidence of obstruction. A normal appendix is visualized. Lymphatic: No suspicious or enlarged lymph nodes in the included lymphatic chains. Reproductive: Normal anteverted uterus. Dominant 3.2 cm follicle in the right ovary. No concerning adnexal lesions. Other: No abdominopelvic free fluid or free gas. No bowel containing hernias. Musculoskeletal: Subchondral vacuum phenomenon involving the left ilium. Bilateral, symmetric sclerosis involving the iliac sides of both SI joints compatible with osteitis condensans ilii. No acute osseous abnormality or suspicious osseous lesion. Benign bone island in the proximal right femur. Review of the MIP images confirms the above findings. IMPRESSION: No evidence of acute aortic syndrome. No acute intrathoracic or intra-abdominal process. Electronically Signed   By: Kreg Shropshire M.D.   On: 07/26/2019 03:49    Cardiac cath on 07/26/2019  The left ventricular systolic function is normal.  LV end diastolic pressure is normal.  The left ventricular ejection fraction is greater than 65% by visual estimate.   1. Normal coronary anatomy- no evidence of SCAD 2. Vigorous LV function 3. Normal LVEDP  Echo IMPRESSIONS    1. The left ventricle has normal systolic function with an ejection fraction of 60-65%. The cavity size was normal. borderline diastolic dysfunction.  2. The right ventricle has normal systolic function. The cavity was  normal. There is no increase in right ventricular wall thickness.  3. The mitral valve is grossly normal.  4. The tricuspid valve is grossly normal.  5. The aortic valve is tricuspid. No stenosis of the aortic valve.  6. The aorta is normal unless otherwise noted.  Subjective: Patient seen and examined at bedside.  She denied any current chest pain, nausea or vomiting.  Discharge Exam: Vitals:   07/26/19 1355 07/26/19 1405  BP: (!) 161/95 (!) 155/82  Pulse: 85 75  Resp: 18 16  Temp:    SpO2: 97% 98%    General: Pt is alert, awake, not in acute distress Cardiovascular: rate controlled, S1/S2 + Respiratory: bilateral decreased breath sounds at bases Abdominal: Soft, NT, ND, bowel sounds + Extremities: no edema, no cyanosis    The results of significant diagnostics from this hospitalization (including imaging, microbiology, ancillary and laboratory) are listed below for reference.     Microbiology: Recent Results (from the past 240 hour(s))  SARS Coronavirus 2 [facility] order, Performed in [facility] hospital lab) Nasopharyngeal Nasopharyngeal Swab     Status: None   Collection Time: 07/26/19  4:52 AM   Specimen: Nasopharyngeal Swab  Result Value Ref Range Status   SARS Coronavirus 2 NEGATIVE NEGATIVE Final    Comment: (NOTE) If result is NEGATIVE SARS-CoV-2 target nucleic acids are NOT DETECTED. The SARS-CoV-2 RNA is generally detectable in upper and lower  respiratory specimens during the acute phase of infection. The lowest  concentration of SARS-CoV-2 viral copies this assay can detect is 250  copies / mL. A negative result does not preclude SARS-CoV-2 infection  and should not be used as the sole basis for treatment or other  patient management decisions.  A negative result may occur with  improper specimen collection / handling, submission of specimen other  than nasopharyngeal swab, presence of viral mutation(s) within the  areas targeted by this assay, and  inadequate number of viral copies  (<250 copies / mL). A negative result must be combined with clinical  observations, patient history, and epidemiological information. If result is POSITIVE SARS-CoV-2 target nucleic acids are DETECTED. The SARS-CoV-2 RNA is generally detectable in upper and lower  respiratory specimens dur ing the acute phase of infection.  Positive  results are indicative of active infection with SARS-CoV-2.  Clinical  correlation with patient history and other diagnostic information is  necessary to determine patient infection status.  Positive results do  not rule out bacterial infection or co-infection with other viruses. If result is PRESUMPTIVE POSTIVE SARS-CoV-2 nucleic acids MAY BE PRESENT.   A presumptive positive result was obtained on the submitted specimen  and confirmed on repeat testing.  While 2019 novel coronavirus  (SARS-CoV-2) nucleic acids may be present in the submitted sample  additional confirmatory testing may be necessary for epidemiological  and / or clinical management purposes  to differentiate between  SARS-CoV-2 and other Sarbecovirus currently known to infect humans.  If clinically indicated additional testing with an alternate test  methodology [phone]) is advised. The SARS-CoV-2 RNA is generally  detectable in upper and lower respiratory sp ecimens during the acute  phase of infection. The expected result is Negative. Fact Sheet for Patients:  StrictlyIdeas.no Fact Sheet for Healthcare Providers: BankingDealers.co.za This test is not yet approved or cleared by the [place] FDA and has been authorized for detection and/or diagnosis of SARS-CoV-2 by FDA under an Emergency Use Authorization (EUA).  This EUA will remain in effect (meaning this test can  be used) for the duration of the COVID-19 declaration under Section 564(b)(1) of the Act, 21 U.S.C. section 360bbb-3(b)(1), unless the  authorization is terminated or revoked sooner. Performed at [facility], [address] [place], [place] [address]      Labs: BNP (last 3 results) No results for input(s): BNP in the last 8760 hours. Basic Metabolic Panel: Recent Labs  Lab 07/25/19 1708 07/26/19 0439  NA 136 135  K 3.6 3.6  CL 106 105  CO2 12* 18*  GLUCOSE 101* 92  BUN 9 6  CREATININE 0.91 0.81  CALCIUM 9.1 8.4*   Liver Function Tests: Recent Labs  Lab 07/26/19 0439  AST 25  ALT 22  ALKPHOS 63  BILITOT 0.4  PROT 7.0  ALBUMIN 3.4*   No results for input(s): LIPASE, AMYLASE in the last 168 hours. No results for input(s): AMMONIA in the last 168 hours. CBC: Recent Labs  Lab 07/25/19 1708 07/26/19 0439  WBC 8.3 12.9*  NEUTROABS  --  10.0*  HGB 14.5 13.5  HCT 44.9 40.9  MCV 86.7 85.0  PLT 419* 334   Cardiac Enzymes: No results for input(s): CKTOTAL, CKMB, CKMBINDEX, TROPONINI in the last 168 hours. BNP: Invalid input(s): POCBNP CBG: No results for input(s): GLUCAP in the last 168 hours. D-Dimer Recent Labs    07/26/19 0101  DDIMER 0.44   Hgb A1c No results for input(s): HGBA1C in the last 72 hours. Lipid Profile No results for input(s): CHOL, HDL, LDLCALC, TRIG, CHOLHDL, LDLDIRECT in the last 72 hours. Thyroid function studies No results for input(s): TSH, T4TOTAL, T3FREE, THYROIDAB in the last 72 hours.  Invalid input(s): FREET3 Anemia work up No results for input(s): VITAMINB12, FOLATE, FERRITIN, TIBC, IRON, RETICCTPCT in the last 72 hours. Urinalysis No results found for: COLORURINE, APPEARANCEUR, LABSPEC, PHURINE, GLUCOSEU, HGBUR, BILIRUBINUR, KETONESUR, PROTEINUR, UROBILINOGEN, NITRITE, LEUKOCYTESUR Sepsis Labs Invalid input(s): PROCALCITONIN,  WBC,  LACTICIDVEN Microbiology Recent Results (from the past 240 hour(s))  SARS Coronavirus 2 [facility](Hospital order, Performed in [facility] lab) Nasopharyngeal Nasopharyngeal Swab     Status: None   Collection Time:  07/26/19  4:52 AM   Specimen: Nasopharyngeal Swab  Result Value Ref Range Status   SARS Coronavirus 2 NEGATIVE NEGATIVE Final    Comment: (NOTE) If result is NEGATIVE SARS-CoV-2 target nucleic acids are NOT DETECTED. The SARS-CoV-2 RNA is generally detectable in upper and lower  respiratory specimens during the acute phase of infection. The lowest  concentration of SARS-CoV-2 viral copies this assay can detect is 250  copies / mL. A negative result does not preclude SARS-CoV-2 infection  and should not be used as the sole basis for treatment or other  patient management decisions.  A negative result may occur with  improper specimen collection / handling, submission of specimen other  than nasopharyngeal swab, presence of viral mutation(s) within the  areas targeted by this assay, and inadequate number of viral copies  (<250 copies / mL). A negative result must be combined with clinical  observations, patient history, and epidemiological information. If result is POSITIVE SARS-CoV-2 target nucleic acids are DETECTED. The SARS-CoV-2 RNA is generally detectable in upper and lower  respiratory specimens dur ing the acute phase of infection.  Positive  results are indicative of active infection with SARS-CoV-2.  Clinical  correlation with patient history and other diagnostic information is  necessary to determine patient infection status.  Positive results do  not rule out bacterial infection or co-infection with other viruses. If  result is PRESUMPTIVE POSTIVE SARS-CoV-2 nucleic acids MAY BE PRESENT.   A presumptive positive result was obtained on the submitted specimen  and confirmed on repeat testing.  While 2019 novel coronavirus  (SARS-CoV-2) nucleic acids may be present in the submitted sample  additional confirmatory testing may be necessary for epidemiological  and / or clinical management purposes  to differentiate between  SARS-CoV-2 and other Sarbecovirus currently known to  infect humans.  If clinically indicated additional testing with an alternate test  methodology [phone] is advised. The SARS-CoV-2 RNA is generally  detectable in upper and lower respiratory sp ecimens during the acute  phase of infection. The expected result is Negative. Fact Sheet for Patients:  BoilerBrush.com.cyhttps://www.fda.gov/media/136312/download Fact Sheet for Healthcare Providers: https://pope.com/https://www.fda.gov/media/136313/download This test is not yet approved or cleared by the [place] FDA and has been authorized for detection and/or diagnosis of SARS-CoV-2 by FDA under an Emergency Use Authorization (EUA).  This EUA will remain in effect (meaning this test can be used) for the duration of the COVID-19 declaration under Section 564(b)(1) of the Act, 21 U.S.C. section 360bbb-3(b)(1), unless the authorization is terminated or revoked sooner. Performed at [facility], [address] [place], [place] [address]      Time coordinating discharge: 35 minutes  SIGNED:   Glade LloydKshitiz Jahniah Pallas, MD  [facility] 07/26/2019, 3:28 PM

## 2019-07-26 NOTE — Progress Notes (Signed)
  Echocardiogram 2D Echocardiogram has been performed.  Burnett Kanaris 07/26/2019, 11:11 AM

## 2019-07-27 ENCOUNTER — Encounter (HOSPITAL_COMMUNITY): Payer: Self-pay | Admitting: Cardiology

## 2019-07-27 MED FILL — Heparin Sodium (Porcine) Inj 1000 Unit/ML: INTRAMUSCULAR | Qty: 10 | Status: AC

## 2019-07-28 ENCOUNTER — Emergency Department (HOSPITAL_COMMUNITY)
Admission: EM | Admit: 2019-07-28 | Discharge: 2019-07-28 | Disposition: A | Discharge status: Home / Self Care | Payer: BC Managed Care – PPO | Attending: Emergency Medicine | Admitting: Emergency Medicine

## 2019-07-28 ENCOUNTER — Other Ambulatory Visit: Payer: Self-pay

## 2019-07-28 ENCOUNTER — Telehealth: Payer: Self-pay | Admitting: Physician Assistant

## 2019-07-28 ENCOUNTER — Encounter (HOSPITAL_COMMUNITY): Payer: Self-pay

## 2019-07-28 DIAGNOSIS — I10 Essential (primary) hypertension: Secondary | ICD-10-CM | POA: Insufficient documentation

## 2019-07-28 DIAGNOSIS — Z9889 Other specified postprocedural states: Secondary | ICD-10-CM | POA: Diagnosis not present

## 2019-07-28 DIAGNOSIS — R2231 Localized swelling, mass and lump, right upper limb: Secondary | ICD-10-CM | POA: Diagnosis not present

## 2019-07-28 DIAGNOSIS — I97630 Postprocedural hematoma of a circulatory system organ or structure following a cardiac catheterization: Secondary | ICD-10-CM | POA: Diagnosis not present

## 2019-07-28 DIAGNOSIS — M7989 Other specified soft tissue disorders: Secondary | ICD-10-CM | POA: Diagnosis not present

## 2019-07-28 DIAGNOSIS — Z79899 Other long term (current) drug therapy: Secondary | ICD-10-CM | POA: Insufficient documentation

## 2019-07-28 NOTE — Telephone Encounter (Signed)
Spoke with the pt and advised her to remove the band aid from her cath site and she may shower and to be careful not to remove any scab formation. I reminded her no heavy lifting over the next several days. Pt is going to [place] to her mom's house but someone else will be driving. She will call if she has any problems or questions.

## 2019-07-28 NOTE — ED Notes (Signed)
[phone]  

## 2019-07-28 NOTE — ED Triage Notes (Signed)
Pt arrives POV for eval of R hand numbness s/p cardiac cath on 8/17. Entry site was R hand. +Pulses radial/ulnar, R hand is cooler, but +CSM to R hand.

## 2019-07-28 NOTE — Consult Note (Signed)
Cardiology Consultation:   Patient ID: Andrea Austin MRN: [id number]; DOB: 14-Jan-1991  Admit date: 07/28/2019 Date of Consult: 07/28/2019  Primary Care Provider: Patient, No Pcp Per Primary Cardiologist: Dr. Cathie Olden Primary Electrophysiologist:  None    Patient Profile:   Andrea Austin is a 28 y.o. female with a hx of anxiety and recent cath for chest pain who is being seen today for the evaluation of right wrist bruising after cath.  History of Present Illness:   Ms. Andrea Austin presented on 8/17 with chest pain and positive troponins. She underwent a cardiac cath through her right wrist that was unsuccessful and then successfully through her groin. Her cath was unremarkable and she was discharged on 8/18. Today, she noticed some new swelling in her right wrist and bruising and presented to the ER for evaluation. She denies any chest pain, palpitations, shortness of breath, lower leg swelling or bleeding.   Heart Pathway Score:     Past Medical History:  Diagnosis Date   Elevated troponin 07/26/2019   Family history of adverse reaction to anesthesia    " [place] MOM"    Past Surgical History:  Procedure Laterality Date   CARDIAC CATHETERIZATION  07/26/2019   LEFT HEART CATH AND CORONARY ANGIOGRAPHY N/A 07/26/2019   Procedure: LEFT HEART CATH AND CORONARY ANGIOGRAPHY;  Surgeon: Martinique, Peter M, MD;  Location: [facility];  Service: Cardiovascular;  Laterality: N/A;   WISDOM TOOTH EXTRACTION       Home Medications:  Prior to Admission medications   Medication Sig Start Date End Date Taking? Authorizing Provider  aspirin EC 81 MG EC tablet Take 1 tablet (81 mg total) by mouth daily. 07/27/19  Yes Aline August, MD  cetirizine (ZYRTEC) 10 MG tablet Take 10 mg by mouth daily.   Yes [provider]  nitroGLYCERIN (NITROSTAT) 0.4 MG SL tablet Place 1 tablet (0.4 mg total) under the tongue every 5 (five) minutes as needed for chest pain.  07/26/19  Yes Aline August, MD  SPRINTEC 28 0.25-35 MG-MCG tablet Take 1 tablet by mouth daily. 06/07/19  Yes [provider]    Inpatient Medications:  Allergies:    Allergies  Allergen Reactions   Ceclor [Cefaclor] Other (See Comments)    shaking   Levofloxacin Other (See Comments)    Ankles and tendens feel weird    Social History:   Social History   Socioeconomic History   Marital status: Single    Spouse name: Not on file   Number of children: Not on file   Years of education: Not on file   Highest education level: Not on file  Occupational History   Not on file  Social Needs   Financial resource strain: Not on file   Food insecurity    Worry: Not on file    Inability: Not on file   Transportation needs    Medical: Not on file    Non-medical: Not on file  Tobacco Use   Smoking status: Never Smoker   Smokeless tobacco: Never Used  Substance and Sexual Activity   Alcohol use: Yes    Comment: RARE   Drug use: Never   Sexual activity: Not on file  Lifestyle   Physical activity    Days per week: Not on file    Minutes per session: Not on file   Stress: Not on file  Relationships   Social connections    Talks on phone: Not on file  Gets together: Not on file    Attends religious service: Not on file    Active member of club or organization: Not on file    Attends meetings of clubs or organizations: Not on file    Relationship status: Not on file   Intimate partner violence    Fear of current or ex partner: Not on file    Emotionally abused: Not on file    Physically abused: Not on file    Forced sexual activity: Not on file  Other Topics Concern   Not on file  Social History Narrative   Not on file    Family History:   Family History  Problem Relation Age of Onset   CAD Maternal Grandmother    Stroke Maternal Grandmother    Diabetes Mellitus II Maternal Grandfather      ROS:  Please see the history of present  illness.  All other ROS reviewed and negative.     Physical Exam/Data:   Vitals:   07/28/19 2005 07/28/19 2006 07/28/19 2100 07/28/19 2115  BP:  (!) 171/112 (!) 158/105 (!) 163/105  Pulse:  (!) 118 99 (!) 105  Resp:  16 17 (!) 26  Temp:  98.8 F (37.1 C)    TempSrc:  Oral    SpO2:  100% 98% 98%  Weight: 86.2 kg     Height: 5\' 6"  (1.676 m)      No intake or output data in the 24 hours ending 07/28/19 2126 Last 3 Weights 07/28/2019 07/26/2019  Weight (lbs) 190 lb 180 lb  Weight (kg) 86.183 kg 81.647 kg     Body mass index is 30.67 kg/m.  General:  Well nourished, well developed, in no acute distress HEENT: normal Neck: no JVD Vascular: 2+ radial pulses bilaterally without bruit.  Cardiac:  normal S1, S2; RRR Lungs:  clear to auscultation bilaterally, no wheezing, rhonchi or rales  Abd: soft, nontender, no hepatomegaly  Ext: no edema Musculoskeletal:  No deformities, BUE and BLE strength normal and equal Skin: warm and dry. Bilateral hands equal in color, sensation, pulses, and temperature. Mild yellow-green appearing ecchymosis over the left dorsom of her hand by her fourth digit Neuro:  CNs 2-12 intact, no focal abnormalities noted Psych:  Normal affect   Relevant CV Studies: Cath 8/18- Normal coronaries  Laboratory Data:  High Sensitivity Troponin:   Recent Labs  Lab 07/25/19 1708 07/26/19 0102 07/26/19 0222 07/26/19 1009  TROPONINIHS 12 734* 1,086* 691*     Cardiac EnzymesNo results for input(s): TROPONINI in the last 168 hours. No results for input(s): TROPIPOC in the last 168 hours.  Chemistry Recent Labs  Lab 07/25/19 1708 07/26/19 0439  NA 136 135  K 3.6 3.6  CL 106 105  CO2 12* 18*  GLUCOSE 101* 92  BUN 9 6  CREATININE 0.91 0.81  CALCIUM 9.1 8.4*  GFRNONAA >60 >60  GFRAA >60 >60  ANIONGAP 18* 12    Recent Labs  Lab 07/26/19 0439  PROT 7.0  ALBUMIN 3.4*  AST 25  ALT 22  ALKPHOS 63  BILITOT 0.4   Hematology Recent Labs  Lab  07/25/19 1708 07/26/19 0439  WBC 8.3 12.9*  RBC 5.18* 4.81  HGB 14.5 13.5  HCT 44.9 40.9  MCV 86.7 85.0  MCH 28.0 28.1  MCHC 32.3 33.0  RDW 13.0 13.2  PLT 419* 334   BNPNo results for input(s): BNP, PROBNP in the last 168 hours.  DDimer  Recent Labs  Lab 07/26/19 0101  DDIMER 0.44     Radiology/Studies:  Dg Chest 2 View  Result Date: 07/25/2019 CLINICAL DATA:  Chest pain EXAM: CHEST - 2 VIEW COMPARISON:  None. FINDINGS: Lungs are clear. Heart size and pulmonary vascularity are normal. No adenopathy. No pneumothorax. No bone lesions. IMPRESSION: No edema or consolidation. Electronically Signed   By: Bretta BangWilliam  Woodruff III M.D.   On: 07/25/2019 17:24   Ct Angio Chest/abd/pel For Dissection W And/or Wo Contrast  Result Date: 07/26/2019 CLINICAL DATA:  Chest, back pain acute, aortic dissection suspected EXAM: CT ANGIOGRAPHY CHEST, ABDOMEN AND PELVIS TECHNIQUE: Multidetector CT imaging through the chest, abdomen and pelvis was performed using the standard protocol during bolus administration of intravenous contrast. Multiplanar reconstructed images and MIPs were obtained and reviewed to evaluate the vascular anatomy. CONTRAST:  100mL OMNIPAQUE IOHEXOL 350 MG/ML SOLN COMPARISON:  Chest radiograph July 25, 2019 FINDINGS: CTA CHEST FINDINGS Cardiovascular: No abnormal aortic wall thickening or features to suggest intramural hematoma. Satisfactory preferential opacification of the aorta The aorta is normal caliber. No intramural hematoma, dissection flap or other luminal abnormality of the aorta is seen. No periaortic stranding or hemorrhage. Normal branching of the great vessels. Normal heart size. No pericardial effusion. Limited evaluation of the central pulmonary arteries reveals no central or large proximal segmental pulmonary embolus. Normal pulmonary artery caliber. Mediastinum/Nodes: No enlarged mediastinal or axillary lymph nodes. Thyroid gland, trachea, and esophagus demonstrate no  significant findings. Lungs/Pleura: No consolidation, features of edema, pneumothorax, or effusion. Musculoskeletal: No chest wall abnormality. No acute or significant osseous findings. Review of the MIP images confirms the above findings. CTA ABDOMEN AND PELVIS FINDINGS VASCULAR Aorta: Normal caliber aorta without aneurysm, dissection, vasculitis or significant stenosis. Celiac: Patent without evidence of aneurysm, dissection, vasculitis or significant stenosis. SMA: Patent without evidence of aneurysm, dissection, vasculitis or significant stenosis. Renals: [place] renal arteries bilaterally. Both renal arteries are patent without evidence of aneurysm, dissection, vasculitis, fibromuscular dysplasia or significant stenosis. IMA: Patent without evidence of aneurysm, dissection, vasculitis or significant stenosis. Inflow: Patent without evidence of aneurysm, dissection, vasculitis or significant stenosis. Veins: No obvious venous abnormality within the limitations of this arterial phase study. Review of the MIP images confirms the above findings. NON-VASCULAR Hepatobiliary: No focal liver abnormality is seen. No gallstones, gallbladder wall thickening, or biliary dilatation. Pancreas: Unremarkable. No pancreatic ductal dilatation or surrounding inflammatory changes. Spleen: Normal in size without focal abnormality. Adrenals/Urinary Tract: Adrenal glands are unremarkable. Early excretion of contrast medium partially opacifies the collecting systems. Kidneys are otherwise unremarkable, without renal calculi, focal lesion, or hydronephrosis. Bladder is unremarkable. Stomach/Bowel: Distal esophagus, stomach and duodenal sweep are unremarkable. No bowel wall thickening or dilatation. No evidence of obstruction. A normal appendix is visualized. Lymphatic: No suspicious or enlarged lymph nodes in the included lymphatic chains. Reproductive: Normal anteverted uterus. Dominant 3.2 cm follicle in the right ovary. No  concerning adnexal lesions. Other: No abdominopelvic free fluid or free gas. No bowel containing hernias. Musculoskeletal: Subchondral vacuum phenomenon involving the left ilium. Bilateral, symmetric sclerosis involving the iliac sides of both SI joints compatible with osteitis condensans ilii. No acute osseous abnormality or suspicious osseous lesion. Benign bone island in the proximal right femur. Review of the MIP images confirms the above findings. IMPRESSION: No evidence of acute aortic syndrome. No acute intrathoracic or intra-abdominal process. Electronically Signed   By: Kreg ShropshirePrice  DeHay M.D.   On: 07/26/2019 03:49    Assessment and Plan:   Andrea Austin is a 28 y.o. female with  a hx of anxiety and recent cath for chest pain who is being seen today for the evaluation of right wrist bruising after cath. Her physical exam is remarkable only for slight ecchymosis over the dorsom of her hand. We discussed the mild bruising is to be expected after catheterization. She has good pulses bilaterally, intact sensation, minor swelling, full range of motion and no bruits to raise concern for any cath related injuries. She was provided reassurance and will follow-up as scheduled.  - Reassurance provided - No further work-up indicated - Ok to discharge from a cardiac standpoint   [facility] will sign off.    For questions or updates, please contact [facility] Please consult [email or web address] for contact info under     Signed, Nicoletta Baoniglio, Korayma Hagwood C, MD  07/28/2019 9:26 PM

## 2019-07-28 NOTE — ED Provider Notes (Signed)
[facility]   CSN: [id number] Arrival date & time: 07/28/19  1958     History   Chief Complaint Chief Complaint  Patient presents with  . Hand Numbness    HPI Andrea Austin is a 28 y.o. female.     HPI  Patient presents via POV for evaluation of right hand complaint.  She had left heart cath access by radial artery 2 days ago.  Had returned home and been asymptomatic for the chest pains that caused her initial presentation.  Earlier this evening she looked at her hand and noted some swelling of the right hand with some bluish discoloration that seemed acute in onset.  States that when she makes a fist it feels tight and also felt tingly.  Has been taking 81 mg aspirin daily since discharge.  No other complaints or concerns.  Called and discussed with the cardiology PA who instructed her to see evaluation due to concern for aneurysm.  Past Medical History:  Diagnosis Date  . Elevated troponin 07/26/2019  . 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 Chest pain 07/26/2019  . Elevated troponin 07/26/2019    Past Surgical History:  Procedure Laterality Date  . CARDIAC CATHETERIZATION  07/26/2019  . LEFT HEART CATH AND CORONARY ANGIOGRAPHY N/A 07/26/2019   Procedure: LEFT HEART CATH AND CORONARY ANGIOGRAPHY;  Surgeon: [place]az[place], Peter M, MD;  [facility];  Service: Cardiovascular;  Laterality: N/A;  . WISDOM TOOTH EXTRACTION       OB History   No obstetric history on file.      Home Medications    Prior to Admission medications   Medication Sig Start Date End Date Taking? Authorizing Provider  aspirin EC 81 MG EC tablet Take 1 tablet (81 mg total) by mouth daily. 07/27/19  Yes Glade LloydAlekh, Kshitiz, MD  cetirizine (ZYRTEC) 10 MG tablet Take 10 mg by mouth daily.   Yes [provider]  nitroGLYCERIN  (NITROSTAT) 0.4 MG SL tablet Place 1 tablet (0.4 mg total) under the tongue every 5 (five) minutes as needed for chest pain. 07/26/19  Yes Glade LloydAlekh, Kshitiz, MD  SPRINTEC 28 0.25-35 MG-MCG tablet Take 1 tablet by mouth daily. 06/07/19  Yes [provider]    Family History Family History  Problem Relation Age of Onset  . CAD Maternal Grandmother   . Stroke Maternal Grandmother   . Diabetes Mellitus II Maternal Grandfather     Social History Social History   Tobacco Use  . Smoking status: Never Smoker  . Smokeless tobacco: Never Used  Substance Use Topics  . Alcohol use: Yes    Comment: RARE  . Drug use: Never     Allergies   Ceclor [cefaclor] and Levofloxacin   Review of Systems Review of Systems  Constitutional: Negative for chills and fever.  HENT: Negative for ear pain and sore throat.   Eyes: Negative for pain and visual disturbance.  Respiratory: Negative for cough and shortness of breath.   Cardiovascular: Negative for chest pain and palpitations.  Gastrointestinal: Negative for abdominal pain and vomiting.  Genitourinary: Negative for dysuria and hematuria.  Musculoskeletal: Negative for arthralgias and back pain.  Skin: Positive for color change. Negative for rash.  Neurological: Negative for seizures and syncope.  All other systems reviewed and are negative.    Physical Exam  Updated Vital Signs BP (!) 163/105   Pulse (!) 105   Temp 98.8 F (37.1 C) (Oral)   Resp (!) 26   Ht 5\' 6"  (1.676 m)   Wt 86.2 kg   LMP 07/07/2019 (Approximate)   SpO2 98%   BMI 30.67 kg/m   Physical Exam Vitals signs and nursing note reviewed.  Constitutional:      General: She is not in acute distress.    Appearance: She is well-developed.  HENT:     Head: Normocephalic and atraumatic.  Eyes:     Conjunctiva/sclera: Conjunctivae normal.  Neck:     Musculoskeletal: Neck supple.  Cardiovascular:     Rate and Rhythm: Normal rate and regular rhythm.     Heart  sounds: No murmur.  Pulmonary:     Effort: Pulmonary effort is normal. No respiratory distress.     Breath sounds: Normal breath sounds.  Abdominal:     Palpations: Abdomen is soft.     Tenderness: There is no abdominal tenderness.  Musculoskeletal:     Comments: Bluish ecchymosis of the hyperthenar eminence.  She has overlying punctate venipuncture mark over the right fourth metacarpal.  Right radial artery access site is clean without bulge.  Palpable pulse.  She has normal sensation in all fingers to light touch.  Normal strength of flexion, extension, and intrinsic muscles of fingers.  Capillary refill in each finger less than 2 seconds and equal in comparison to the left hand.  Hand is warm to the touch.  Skin:    General: Skin is warm and dry.  Neurological:     Mental Status: She is alert.      ED Treatments / Results  Labs (all labs ordered are listed, but only abnormal results are displayed) Labs Reviewed - No data to display  EKG None  Radiology No results found.  Procedures Procedures (including critical care time)  Medications Ordered in ED Medications - No data to display   Initial Impression / Assessment and Plan / ED Course  I have reviewed the triage vital signs and the nursing notes.  Pertinent labs & imaging results that were available during my care of the patient were reviewed by me and considered in my medical decision making (see chart for details).       Andrea Austin is a 28 year old female with no significant medical history with exception of having had chest pain earlier this week that ultimately was also done thorough work-up in the emergency department with chest and abdomen CTA and left heart cath.  Cath did not demonstrate any lesions.  She returned home on 81 mg aspirin.  She was noted to have essential hypertension and directed to establish care with PCP for long-term management.    Here today with concern for cath site complication.  Overall  exam is reassuring.  I have low suspcicion for radial nerve injury, or artery complications such as aneurysm or hematoma.  She has layering of venous blood likely from venipuncture.  Mild hand swelling endorsed that her is not impressive on exam.  Discussed with on-call cardiology provider who evaluated bedside and did not recommend imaging.  They also found her exam reassuring and specifically they do not think that imaging or other work-up is indicated.  Notably she was hypertensive and tachycardic here.  She states that she has not had liquids to drink all afternoon and does feel anxious due to the setting.  Appropriate for discharge.  Can continue her outpatient course as  directed and will follow-up with PCP for hypertension.  Patient vocalized understanding and agreement with plan and had no other questions or concerns.  Was discharged in good condition.    Final Clinical Impressions(s) / ED Diagnoses   Final diagnoses:  Localized swelling on right hand  History of recent vascular procedure  Essential hypertension    ED Discharge Orders    None       Tillie Fantasia, MD 07/28/19 2129    Blanchie Dessert, MD 07/30/19 2108

## 2019-07-28 NOTE — Telephone Encounter (Signed)
New Message     Pt is calling and would like for a nurse to call her and go over after care instructions     Please call

## 2019-07-28 NOTE — Telephone Encounter (Signed)
   The patient's mother called the answering service after-hours today. Patient had recent heart cath on Tuesday. Her mother has noticed that she has developed significant bruising on her hand (turning blue per answering service page) and new swelling this evening at radial cath site, extendinf to fingers as well. This was not present post-cath. Unfortunately given recent arterial stick I cannot exclude development of pseudoaneurysm or other post cath complication over the phone and recommended ER evaluation so she can be evaluated in person. Unfortunately mother was not happy with recommendation and lamented over dissatisfaction with recent ER experience. I explained nature of the concerns and need for evaluation; they will consider going. Will route to triage to f/u patient in AM to determine if she needs in office eval if she decides not to go.  Charlie Pitter

## 2019-07-28 NOTE — Telephone Encounter (Signed)
Received call back to answering service that patient's mother called 5 different hospitals and there are up to 35 people in the waiting room nearby and they remain concerned about having to wait in an ER. She asked if she should just put her daughter in the car and drive her to to the hospital in [place] in order to expedite care. She indicates her daughter's arm is getting darker and starting to feel numb as well. I reiterated my recommendation to go to the local ER, as I do not think driving to [facility] is a safe decision especially if symptoms are getting worse. I understand their frustration about the emergency department but unfortunately I do not have control over those processes and feel she is best served seeking care. I wish I had a better answer for them, but unfortunately that is the recommendation. Mother verbalized understanding.  Kenzee Bassin PA-C

## 2019-07-29 NOTE — Telephone Encounter (Signed)
I talked with patient and mother. She was evaluated in the ER .  Has kept hand elevated and swelling has improved.  Will make he an appt to see me in several weeks.   ( she is going out of town to [employer] next week )

## 2019-08-01 LAB — ECHOCARDIOGRAM COMPLETE: Weight: 2880 oz

## 2019-08-17 NOTE — Progress Notes (Signed)
Cardiology Office Note:    Date:  08/18/2019   ID:  Andrea LernerAlyssa Moore Austin, DOB 17-Aug-1991, MRN [id number]  PCP:  Patient, No Pcp Per  Cardiologist:  Jojo Geving  Electrophysiologist:  None   Referring MD: Glade LloydAlekh, Kshitiz, MD   Chief Complaint  Patient presents with  . Chest Pain    Sept, 10, 2020    Andrea Austin is a 28 y.o. female with a hx of chest pain. She was admitted to the hospital recently and had mild ECG abn and  + troponins. Cath showed no significant CAD or SCAD. Thought to perhaps have myocarditis Had Right radial access complications ( bruising and pain following cath )   No further episodes of chest pain.    Arm has healed well .   Past Medical History:  Diagnosis Date  . Elevated troponin 07/26/2019  . Family history of adverse reaction to anesthesia    " HARD TIME WAKING MY MOM"    Past Surgical History:  Procedure Laterality Date  . CARDIAC CATHETERIZATION  07/26/2019  . LEFT HEART CATH AND CORONARY ANGIOGRAPHY N/A 07/26/2019   Procedure: LEFT HEART CATH AND CORONARY ANGIOGRAPHY;  Surgeon: SwazilandJordan, Peter M, MD;  [facility];  Service: Cardiovascular;  Laterality: N/A;  . WISDOM TOOTH EXTRACTION      Current Medications: Current Meds  Medication Sig  . aspirin EC 81 MG EC tablet Take 1 tablet (81 mg total) by mouth daily.  . cetirizine (ZYRTEC) 10 MG tablet Take 10 mg by mouth daily.  . nitroGLYCERIN (NITROSTAT) 0.4 MG SL tablet Place 1 tablet (0.4 mg total) under the tongue every 5 (five) minutes as needed for chest pain.  Marland Kitchen. SPRINTEC 28 0.25-35 MG-MCG tablet Take 1 tablet by mouth daily.     Allergies:   Ceclor [cefaclor] and Levofloxacin   Social History   Socioeconomic History  . Marital status: Single    Spouse name: Not on file  . Number of children: Not on file  . Years of education: Not on file  . Highest education level: Not on file  Occupational History  . Not on file  Social Needs  . Financial resource strain: Not  on file  . Food insecurity    Worry: Not on file    Inability: Not on file  . Transportation needs    Medical: Not on file    Non-medical: Not on file  Tobacco Use  . Smoking status: Never Smoker  . Smokeless tobacco: Never Used  Substance and Sexual Activity  . Alcohol use: Yes    Comment: RARE  . Drug use: Never  . Sexual activity: Not on file  Lifestyle  . Physical activity    Days per week: Not on file    Minutes per session: Not on file  . Stress: Not on file  Relationships  . Social Musicianconnections    Talks on phone: Not on file    Gets together: Not on file    Attends religious service: Not on file    Active member of club or organization: Not on file    Attends meetings of clubs or organizations: Not on file    Relationship status: Not on file  Other Topics Concern  . Not on file  Social History Narrative  . Not on file     Family History: The patient's family history includes CAD in her maternal grandmother; Diabetes Mellitus II in her maternal grandfather; Stroke in her maternal grandmother.  ROS:  Please see the history of present illness.     All other systems reviewed and are negative.  EKGs/Labs/Other Studies Reviewed:    The following studies were reviewed today:   EKG:   Recent Labs: 07/26/2019: ALT 22; BUN 6; Creatinine, Ser 0.81; Hemoglobin 13.5; Platelets 334; Potassium 3.6; Sodium 135  Recent Lipid Panel No results found for: CHOL, TRIG, HDL, CHOLHDL, VLDL, LDLCALC, LDLDIRECT  Physical Exam:    VS:  BP (!) 170/92 (BP Location: Left Arm, Patient Position: Sitting, Cuff Size: Normal)   Pulse 96   Ht 5\' 6"  (1.676 m)   Wt 184 lb 8 oz (83.7 kg)   BMI 29.78 kg/m     Wt Readings from Last 3 Encounters:  08/18/19 184 lb 8 oz (83.7 kg)  07/28/19 190 lb (86.2 kg)  07/26/19 180 lb (81.6 kg)     GEN:  Well nourished, well developed in no acute distress HEENT: Normal NECK: No JVD; No carotid bruits LYMPHATICS: No lymphadenopathy CARDIAC: RRR,  no murmurs, rubs, gallops RESPIRATORY:  Clear to auscultation without rales, wheezing or rhonchi  ABDOMEN: Soft, non-tender, non-distended MUSCULOSKELETAL:  No edema; , R radial pulse is weak.  ulner pulse is strong .  SKIN: Warm and dry NEUROLOGIC:  Alert and oriented x 3 PSYCHIATRIC:  Normal affect   ASSESSMENT:    No diagnosis found. PLAN:    In order of problems listed above:  1. Myocarditis :   No further CP.   Doing well  2.   Right radial injury:   S/p cath.   Radial pulse is somewhat diminished  - good ulnar pulse.   She is stable We discussed continuing ASA for several months - she may want to continue longer.   I will see her on an as needed basis .     Medication Adjustments/Labs and Tests Ordered: Current medicines are reviewed at length with the patient today.  Concerns regarding medicines are outlined above.  No orders of the defined types were placed in this encounter.  No orders of the defined types were placed in this encounter.   Patient Instructions  Medication Instructions:  Your physician recommends that you continue on your current medications as directed. Please refer to the Current Medication list given to you today.  If you need a refill on your cardiac medications before your next appointment, please call your pharmacy.   Lab work: None Ordered If you have labs (blood work) drawn today and your tests are completely normal, you will receive your results only by: Marland Kitchen MyChart Message (if you have MyChart) OR . A paper copy in the mail If you have any lab test that is abnormal or we need to change your treatment, we will call you to review the results.  Testing/Procedures: None Ordered  Follow-Up: At [facility], you and your health needs are our priority.  As part of our continuing mission to provide you with exceptional heart care, we have created designated Provider Care Teams.  These Care Teams include your primary Cardiologist (physician)  and Advanced Practice Providers (APPs -  Physician Assistants and Nurse Practitioners) who all work together to provide you with the care you need, when you need it. You will need a follow up appointment in:  As Needed       Signed, Mertie Moores, MD  08/18/2019 8:18 AM    [place]

## 2019-08-18 ENCOUNTER — Encounter: Payer: Self-pay | Admitting: Cardiovascular Disease

## 2019-08-18 ENCOUNTER — Ambulatory Visit (INDEPENDENT_AMBULATORY_CARE_PROVIDER_SITE_OTHER): Payer: BC Managed Care – PPO | Admitting: Cardiovascular Disease

## 2019-08-18 ENCOUNTER — Other Ambulatory Visit: Payer: Self-pay

## 2019-08-18 VITALS — BP 170/92 | HR 96 | Ht 66.0 in | Wt 184.5 lb

## 2019-08-18 DIAGNOSIS — R079 Chest pain, unspecified: Secondary | ICD-10-CM

## 2019-08-18 DIAGNOSIS — R7989 Other specified abnormal findings of blood chemistry: Secondary | ICD-10-CM

## 2019-08-18 DIAGNOSIS — R778 Other specified abnormalities of plasma proteins: Secondary | ICD-10-CM

## 2019-08-18 NOTE — Patient Instructions (Signed)
Medication Instructions:  Your physician recommends that you continue on your current medications as directed. Please refer to the Current Medication list given to you today.  If you need a refill on your cardiac medications before your next appointment, please call your pharmacy.   Lab work: None Ordered If you have labs (blood work) drawn today and your tests are completely normal, you will receive your results only by: Marland Kitchen MyChart Message (if you have MyChart) OR . A paper copy in the mail If you have any lab test that is abnormal or we need to change your treatment, we will call you to review the results.  Testing/Procedures: None Ordered  Follow-Up: At [facility], you and your health needs are our priority.  As part of our continuing mission to provide you with exceptional heart care, we have created designated Provider Care Teams.  These Care Teams include your primary Cardiologist (physician) and Advanced Practice Providers (APPs -  Physician Assistants and Nurse Practitioners) who all work together to provide you with the care you need, when you need it. You will need a follow up appointment in:  As Needed

## 2019-08-23 ENCOUNTER — Ambulatory Visit: Payer: BC Managed Care – PPO | Admitting: Physician Assistant

## 2019-09-22 ENCOUNTER — Telehealth: Payer: Self-pay | Admitting: Cardiovascular Disease

## 2019-09-22 DIAGNOSIS — R002 Palpitations: Secondary | ICD-10-CM

## 2019-09-22 MED ORDER — PROPRANOLOL HCL 10 MG PO TABS: 10.0000 mg | ORAL_TABLET | Freq: Four times a day (QID) | ORAL | 6 refills | Status: DC | PRN

## 2019-09-22 NOTE — Telephone Encounter (Signed)
Spoke with patient who states she is having fast HR intermittently, especially over the last 2 days. States symptoms began yesterday with a weird anxious feeling without a known trigger. States she tried some calming techniques; HR was in the 80's bpm but felt like her heart was pounding. States her Apple watch showed atrial fibrillation but she was not able to capture the rhythm. A little while later, EKG showed normal rhythm. This morning, she woke up and felt "fine" but BP was slightly elevated. States as the day progressed she has had a few events of HR feeling fast or pounding. Reports similar symptoms occurred a month ago on the last week of her birth control pill pack, right before she started her menstrual cycle. She wonders if her symptoms are related to hormones. I advised her to try to capture any abnormality on her Apple watch and she agrees and asks if she can wear a long term heart monitor. I advised that we will place the order for a 30 day monitor and I verified her mailing address. I explained how to use propranolol 10 mg up to 4 times per day as needed in addition to the monitor. I advised her to call back prior to the end of the monitoring period if she has questions or concerns or if she is able to verify a recording of a fib. She verbalized understanding and agreement with plan and thanked me for the call.

## 2019-09-22 NOTE — Telephone Encounter (Signed)
Lets start Propranolol 10 mg QID PRN  While her HR is fast, this is not excessive and might be related to her coffee yesterday   I can see her tomorrow at 8:00 am

## 2019-09-22 NOTE — Telephone Encounter (Signed)
STAT if HR is under 50 or over 120 (normal HR is 60-100 beats per minute)  1) What is your heart rate? 129  2) Do you have a log of your heart rate readings (document readings)? Yes  3) Do you have any other symptoms?  feels anxious, blood pressure is a little high 140/86

## 2019-09-22 NOTE — Telephone Encounter (Signed)
Spoke with pt and was tearful while on phone Pt is feeling anxious due to heart rate running high before ending call HR was 126 no other symptoms.Pt noted elevated HR yesterday evening while watching TV.Per pt did have cup of coffee yesterday and this am had decaf Pt also has not taken OTC with decongestant.Wil forward to Dr Acie Fredrickson for review and recommendations./cy

## 2019-09-30 ENCOUNTER — Telehealth: Payer: Self-pay

## 2019-09-30 NOTE — Telephone Encounter (Signed)
Spoke to pt, went over monitor instructions. Verified address. 30 day event monitor ordered.  

## 2019-10-10 ENCOUNTER — Ambulatory Visit (INDEPENDENT_AMBULATORY_CARE_PROVIDER_SITE_OTHER): Payer: BC Managed Care – PPO

## 2019-10-10 DIAGNOSIS — R002 Palpitations: Secondary | ICD-10-CM

## 2019-11-15 ENCOUNTER — Emergency Department (HOSPITAL_COMMUNITY): Payer: BC Managed Care – PPO

## 2019-11-15 ENCOUNTER — Emergency Department (HOSPITAL_COMMUNITY)
Admission: EM | Admit: 2019-11-15 | Discharge: 2019-11-15 | Disposition: A | Discharge status: Home / Self Care | Payer: BC Managed Care – PPO | Attending: Emergency Medicine | Admitting: Emergency Medicine

## 2019-11-15 ENCOUNTER — Other Ambulatory Visit: Payer: Self-pay

## 2019-11-15 ENCOUNTER — Encounter (HOSPITAL_COMMUNITY): Payer: Self-pay | Admitting: Radiology

## 2019-11-15 DIAGNOSIS — R0789 Other chest pain: Secondary | ICD-10-CM | POA: Diagnosis not present

## 2019-11-15 DIAGNOSIS — R072 Precordial pain: Secondary | ICD-10-CM | POA: Diagnosis not present

## 2019-11-15 DIAGNOSIS — R000 Tachycardia, unspecified: Secondary | ICD-10-CM | POA: Insufficient documentation

## 2019-11-15 DIAGNOSIS — R42 Dizziness and giddiness: Secondary | ICD-10-CM | POA: Diagnosis not present

## 2019-11-15 DIAGNOSIS — I10 Essential (primary) hypertension: Secondary | ICD-10-CM

## 2019-11-15 DIAGNOSIS — Z7982 Long term (current) use of aspirin: Secondary | ICD-10-CM | POA: Insufficient documentation

## 2019-11-15 DIAGNOSIS — R079 Chest pain, unspecified: Secondary | ICD-10-CM

## 2019-11-15 DIAGNOSIS — I499 Cardiac arrhythmia, unspecified: Secondary | ICD-10-CM | POA: Diagnosis not present

## 2019-11-15 LAB — CBC
HCT: 44.4 % (ref 36.0–46.0)
Hemoglobin: 14.7 g/dL (ref 12.0–15.0)
MCH: 28 pg (ref 26.0–34.0)
MCHC: 33.1 g/dL (ref 30.0–36.0)
MCV: 84.6 fL (ref 80.0–100.0)
Platelets: 389 10*3/uL (ref 150–400)
RBC: 5.25 MIL/uL — ABNORMAL HIGH (ref 3.87–5.11)
RDW: 13.1 % (ref 11.5–15.5)
WBC: 11.3 10*3/uL — ABNORMAL HIGH (ref 4.0–10.5)
nRBC: 0 % (ref 0.0–0.2)

## 2019-11-15 LAB — BASIC METABOLIC PANEL
Anion gap: 12 (ref 5–15)
BUN: 8 mg/dL (ref 6–20)
CO2: 21 mmol/L — ABNORMAL LOW (ref 22–32)
Calcium: 8.9 mg/dL (ref 8.9–10.3)
Chloride: 105 mmol/L (ref 98–111)
Creatinine, Ser: 0.77 mg/dL (ref 0.44–1.00)
GFR calc Af Amer: >60 mL/min (ref >60)
GFR calc non Af Amer: >60 mL/min (ref >60)
Glucose, Bld: 92 mg/dL (ref 70–99)
Potassium: 4.4 mmol/L (ref 3.5–5.1)
Sodium: 138 mmol/L (ref 135–145)

## 2019-11-15 LAB — TROPONIN I (HIGH SENSITIVITY)
Troponin I (High Sensitivity): 4 ng/L (ref <18)
Troponin I (High Sensitivity): 2 ng/L (ref <18)

## 2019-11-15 LAB — I-STAT BETA HCG BLOOD, ED (MC, WL, AP ONLY): I-stat hCG, quantitative: <5 m[IU]/mL (ref <5)

## 2019-11-15 LAB — D-DIMER, QUANTITATIVE: D-Dimer, Quant: 0.66 ug/mL-FEU — ABNORMAL HIGH (ref 0.00–0.50)

## 2019-11-15 MED ORDER — SODIUM CHLORIDE 0.9% FLUSH: 3.0000 mL | Freq: Once | INTRAVENOUS | Status: DC

## 2019-11-15 MED ORDER — IBUPROFEN 600 MG PO TABS: 600.0000 mg | ORAL_TABLET | Freq: Four times a day (QID) | ORAL | 0 refills | Status: AC | PRN

## 2019-11-15 MED ORDER — IOHEXOL 350 MG/ML SOLN
80.0000 mL | Freq: Once | INTRAVENOUS | Status: AC | PRN
  Administered 2019-11-15: 80 mL via INTRAVENOUS

## 2019-11-15 MED ORDER — SODIUM CHLORIDE 0.9 % IV BOLUS
1000.0000 mL | Freq: Once | INTRAVENOUS | Status: AC
  Administered 2019-11-15: 1000 mL via INTRAVENOUS

## 2019-11-15 NOTE — ED Notes (Signed)
Called lab to add on D dimer  

## 2019-11-15 NOTE — ED Triage Notes (Signed)
To hallway via EMS.  Onset this morning sharp chest pain while washing hands, after few minutes pain was pressure.   EMS  BP 172/103 SpO2 100%  HR 107  EKG SA HR 108-150 Pt took ASA 324 and NTG x 2 pain decreased from 6/10 to 0/10 no pain at this time.

## 2019-11-15 NOTE — ED Notes (Signed)
No answer for triage.

## 2019-11-15 NOTE — ED Notes (Signed)
Patient transported to CT 

## 2019-11-15 NOTE — ED Provider Notes (Signed)
[facility] Provider Note   CSN: [id number] Arrival date & time: 11/15/19  1833     History   Chief Complaint Chief Complaint  Patient presents with  . Chest Pain    HPI Andrea Austin is a 28 y.o. female.     Patient had onset chest pain.  Took nitro x2 and baby aspirin x4.  EMS arrived and transported patient to hospital.  Patient denies any travel over long distances including airplane ride or long car rides.  Patient has no family history of early cardiac death.   The history is provided by the patient.  Chest Pain Pain location:  Substernal area Pain quality: pressure, sharp and stabbing   Pain radiates to:  Does not radiate Pain severity:  Mild Onset quality:  Sudden Duration:  3 hours Timing:  Constant Progression:  Unchanged Chronicity:  Recurrent Context: breathing, at rest and stress   Context: not drug use, not eating, not intercourse, not lifting, not movement, not raising an arm and not trauma   Relieved by:  Nitroglycerin Worsened by:  Nothing Associated symptoms: no abdominal pain, no altered mental status, no anxiety, no back pain, no cough, no diaphoresis, no dizziness, no fever, no headache, no heartburn, no lower extremity edema, no nausea, no near-syncope, no numbness, no orthopnea, no PND, no shortness of breath, no vomiting and no weakness   Risk factors: birth control and hypertension   Risk factors: no prior DVT/PE and no smoking     Past Medical History:  Diagnosis Date  . Elevated troponin 07/26/2019  . 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 Chest pain 07/26/2019  . Elevated troponin 07/26/2019    Past Surgical History:  Procedure Laterality Date  . CARDIAC CATHETERIZATION  07/26/2019  . LEFT HEART CATH AND CORONARY ANGIOGRAPHY N/A 07/26/2019   Procedure: LEFT HEART CATH AND CORONARY ANGIOGRAPHY;   Surgeon: SwazilandJordan, Peter M, MD;  [facility];  Service: Cardiovascular;  Laterality: N/A;  . WISDOM TOOTH EXTRACTION       OB History   No obstetric history on file.      Home Medications    Prior to Admission medications   Medication Sig Start Date End Date Taking? Authorizing Provider  aspirin EC 81 MG EC tablet Take 1 tablet (81 mg total) by mouth daily. 07/27/19  Yes Glade LloydAlekh, Kshitiz, MD  cetirizine (ZYRTEC) 10 MG tablet Take 10 mg by mouth every evening.    Yes [provider]  nitroGLYCERIN (NITROSTAT) 0.4 MG SL tablet Place 1 tablet (0.4 mg total) under the tongue every 5 (five) minutes as needed for chest pain. 07/26/19  Yes Glade LloydAlekh, Kshitiz, MD  propranolol (INDERAL) 10 MG tablet Take 1 tablet (10 mg total) by mouth 4 (four) times daily as needed. Patient taking differently: Take 10 mg by mouth 4 (four) times daily as needed (palpatations).  09/22/19  Yes Nahser, Deloris PingPhilip J, MD  SPRINTEC 28 0.25-35 MG-MCG tablet Take 1 tablet by mouth daily. 06/07/19  Yes [provider]    Family History Family History  Problem Relation Age of Onset  . CAD Maternal Grandmother   . Stroke Maternal Grandmother   . Diabetes Mellitus II Maternal Grandfather     Social History Social History   Tobacco Use  . Smoking status: Never Smoker  . Smokeless tobacco: Never  Used  Substance Use Topics  . Alcohol use: Yes    Comment: RARE  . Drug use: Never     Allergies   Ceclor [cefaclor] and Levofloxacin   Review of Systems Review of Systems  Constitutional: Negative for diaphoresis and fever.  Respiratory: Negative for cough and shortness of breath.   Cardiovascular: Positive for chest pain. Negative for orthopnea, PND and near-syncope.  Gastrointestinal: Negative for abdominal pain, heartburn, nausea and vomiting.  Musculoskeletal: Negative for back pain.  Neurological: Negative for dizziness, weakness, numbness and headaches.     Physical Exam Updated  Vital Signs BP (!) 160/97   Pulse (!) 109   Temp 98.6 F (37 C) (Oral)   Resp 18   SpO2 100%   Physical Exam Vitals signs reviewed.  Constitutional:      General: She is not in acute distress.    Appearance: She is not diaphoretic.  HENT:     Head: Normocephalic and atraumatic.  Eyes:     Extraocular Movements: Extraocular movements intact.     Pupils: Pupils are equal, round, and reactive to light.  Neck:     Musculoskeletal: Neck supple.     Vascular: No JVD.  Cardiovascular:     Rate and Rhythm: Regular rhythm. Tachycardia present.     Heart sounds: Normal heart sounds.  Pulmonary:     Effort: Pulmonary effort is normal.     Breath sounds: Normal breath sounds.  Chest:     Chest wall: Tenderness present. No edema.  Abdominal:     General: Bowel sounds are normal.     Palpations: Abdomen is soft.     Tenderness: There is no abdominal tenderness.  Musculoskeletal: Normal range of motion.     Right lower leg: She exhibits no tenderness. No edema.     Left lower leg: She exhibits no tenderness. No edema.  Lymphadenopathy:     Cervical: No cervical adenopathy.  Skin:    General: Skin is warm and dry.  Neurological:     General: No focal deficit present.     Mental Status: She is alert and oriented to person, place, and time.  Psychiatric:        Mood and Affect: Mood normal.        Behavior: Behavior normal.      ED Treatments / Results  Labs (all labs ordered are listed, but only abnormal results are displayed) Labs Reviewed  BASIC METABOLIC PANEL - Abnormal; Notable for the following components:      Result Value   CO2 21 (*)    All other components within normal limits  CBC - Abnormal; Notable for the following components:   WBC 11.3 (*)    RBC 5.25 (*)    All other components within normal limits  D-DIMER, QUANTITATIVE (NOT AT [facility])  I-STAT BETA HCG BLOOD, ED ([facility], [facility], [facility] ONLY)  TROPONIN I (HIGH SENSITIVITY)  TROPONIN I (HIGH SENSITIVITY)    EKG  EKG Interpretation  Date/Time:  Tuesday November 15 2019 18:51:00 EST Ventricular Rate:  98 PR Interval:  124 QRS Duration: 92 QT Interval:  356 QTC Calculation: 454 R Axis:   28 Text Interpretation: Sinus rhythm with marked sinus arrhythmia Otherwise normal ECG Since last tracing rate slower Confirmed by Jacalyn Lefevre [phone]) on 11/15/2019 8:10:39 PM   Radiology Dg Chest 2 View  Result Date: 11/15/2019 CLINICAL DATA:  Chest pain. EXAM: CHEST - 2 VIEW COMPARISON:  July 25, 2019. FINDINGS: The heart size and mediastinal  contours are within normal limits. Both lungs are clear. No pneumothorax or pleural effusion is noted. The visualized skeletal structures are unremarkable. IMPRESSION: No active cardiopulmonary disease. Electronically Signed   By: Marijo Conception M.D.   On: 11/15/2019 19:42    Procedures Procedures (including critical care time)  Medications Ordered in ED Medications  sodium chloride flush (NS) 0.9 % injection 3 mL (has no administration in time range)  sodium chloride 0.9 % bolus 1,000 mL (1,000 mLs Intravenous New Bag/Given 11/15/19 2121)     Initial Impression / Assessment and Plan / ED Course  I have reviewed the triage vital signs and the nursing notes.  Pertinent labs & imaging results that were available during my care of the patient were reviewed by me and considered in my medical decision making (see chart for details).       Patient presents with sudden onset recurrence of chest pain.  Patient is tachycardic, otherwise vital signs are normal and satting well on room air. Similar to presentation back in August for which patient had hospitalization for elevated troponins and outpatient cardiology follow-up.  Patient had normal catheterization at that time.  It was felt to be myocarditis per last cardiology note in September.  Today EKG shows sinus tach without ST changes.  Chest x-ray negative for acute cardiopulmonary disease.  Pending labs include CBC,  BMP, troponin, D-dimer. Will give 1 L NS bolus. Will assess for elevated troponins if patient has recurrence. Concern for PE, will ordered CTA to rule out PE. No concern for infectious or GI processes at this time.   Patient also with hypertension. Recommend follow up with PCP for further management.    Final Clinical Impressions(s) / ED Diagnoses   Final diagnoses:  Hypertension, unspecified type  Nonspecific chest pain    ED Discharge Orders    None       Bonnita Hollow, MD 11/15/19 2212    Isla Pence, MD 11/15/19 2234

## 2019-11-15 NOTE — ED Notes (Signed)
Patient verbalizes understanding of discharge instructions. Opportunity for questioning and answers were provided. Armband removed by staff, pt discharged from ED.  

## 2019-11-15 NOTE — Discharge Instructions (Addendum)
Please follow up with your regular doctor

## 2019-11-16 ENCOUNTER — Telehealth: Payer: Self-pay | Admitting: Cardiovascular Disease

## 2019-11-16 NOTE — Telephone Encounter (Signed)
I spoke with pt's mother. She reports pt had chest pain last night. Took NTG and ASA and was evaluated in ED. CTA done which showed no evidence of PE Pt is feeling fine today.  Mother has questions and concerns regarding possible blood clots. Pt has been on birth control pill for about 10 years for excessive bleeding.  She is asking if pt should continue this medication. Heart rate today is 82 which is higher than usual for pt. BP was elevated in ED. I told pt I would forward to Dr Acie Fredrickson for review and we would call her back with his recommendations.

## 2019-11-16 NOTE — Telephone Encounter (Signed)
She was evaluated in the ER Troponin was negative D-dimer was very minimally elevated but CT angio was negative for pulmonary embolus. I do not think there is any reason to stop her birth control meds

## 2019-11-16 NOTE — Telephone Encounter (Signed)
Spoke with patient's mother and patient speaking from the background I reviewed Dr. Elmarie Shiley advice with her and answered her questions. She states patient took NTG and aspirin thinking that she could have a clot. The patient had chest pain associated with fast HR. She states she thought the NTG was for chest pain so that is why she took it. She asks if a clot could have dissolved by the time she arrived at the hospital. I reviewed Dr. Elmarie Shiley advice following the catheterization in August and answered mother's questions about clotting and coronary spasm. I asked if patient has ever been evaluated for a clotting disorder or other bleeding disorders and she denies. She states she will f/u with PCP about elevated RBC and platelet counts. I explained the difference in sinus tachycardia and SVT and advised patient to monitor for symptoms and measure HR in the future if this occurs again.  I reviewed the use of propranolol and advised patient to use it in the future if she has fast HR to try propranolol 10 mg, repeat in 20 minutes if symptoms persist. I advised her to call back to report additional episodes or with other questions or concerns. Patient's mother verbalized understanding and agreement with plan and thanked me for the call.

## 2019-11-16 NOTE — Telephone Encounter (Signed)
STAT if HR is under 50 or over 120 (normal HR is 60-100 beats per minute)  1) What is your heart rate? 89  2) Do you have a log of your heart rate readings (document readings)? Yes on her watch  3) Do you have any other symptoms? Patient is having a fast HR. Pt went by EMS to the hospital last night   Mother of the patient called. Last night the patient was having some chest pain and took 2 nitroglycerin and 4 baby aspirin. The patient was hoping the medication would reduce the pain, but it did not, and the patient called the EMS.  Patient went by EMS to the hospital last night. The blood work and CT showed that there was no heart damage.  The patient is still having a fast HR. The mother wants to know what she should do next.

## 2019-11-29 ENCOUNTER — Other Ambulatory Visit: Payer: Self-pay | Admitting: Cardiovascular Disease

## 2019-12-30 DIAGNOSIS — R079 Chest pain, unspecified: Secondary | ICD-10-CM | POA: Diagnosis not present

## 2019-12-30 DIAGNOSIS — Z309 Encounter for contraceptive management, unspecified: Secondary | ICD-10-CM | POA: Diagnosis not present

## 2019-12-30 DIAGNOSIS — R000 Tachycardia, unspecified: Secondary | ICD-10-CM | POA: Diagnosis not present

## 2020-02-28 ENCOUNTER — Other Ambulatory Visit: Payer: Self-pay | Admitting: Cardiovascular Disease

## 2020-05-02 DIAGNOSIS — Z0000 Encounter for general adult medical examination without abnormal findings: Secondary | ICD-10-CM | POA: Diagnosis not present

## 2020-05-02 DIAGNOSIS — Z13220 Encounter for screening for lipoid disorders: Secondary | ICD-10-CM | POA: Diagnosis not present

## 2020-05-02 DIAGNOSIS — M25552 Pain in left hip: Secondary | ICD-10-CM | POA: Diagnosis not present

## 2020-08-17 IMAGING — CT CT ANGIO CHEST-ABD-PELV FOR DISSECTION W/ AND WO/W CM
2 of 8 series · 14 of 46 positions shown, 16 images · IV contrast (omnipaque)
Comparison: Chest radiograph July 25, 2019

CLINICAL DATA: Chest, back pain acute, aortic dissection suspected

EXAM:
CT ANGIOGRAPHY CHEST, ABDOMEN AND PELVIS
TECHNIQUE: Multidetector CT imaging through the chest, abdomen and pelvis was
performed using the standard protocol during bolus administration of
intravenous contrast. Multiplanar reconstructed images and MIPs were
obtained and reviewed to evaluate the vascular anatomy.
CONTRAST:  100mL OMNIPAQUE IOHEXOL 350 MG/ML SOLN

[Series 7: dissection 2mm · axial · 0.71mm/px · z∈[+783,+1327]mm · 11 of 306 slices shown, 13 images]
[im 17/306  soft-tissue]
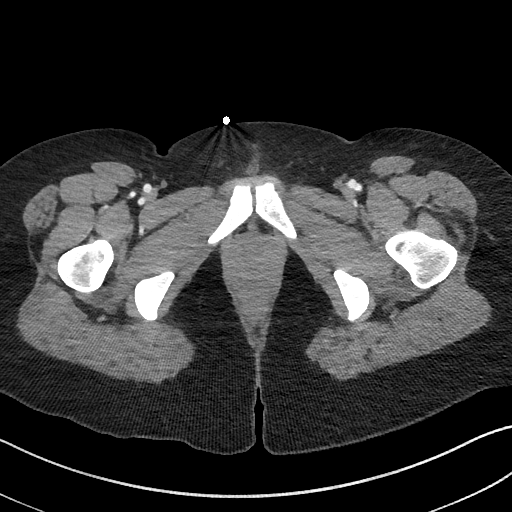
[im 17/306  bone]
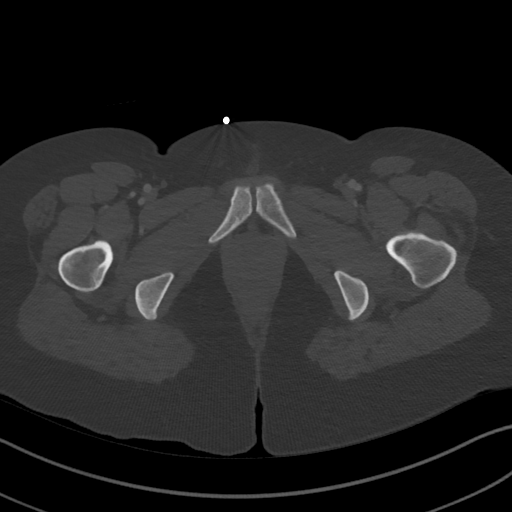
[im 51/306  soft-tissue]
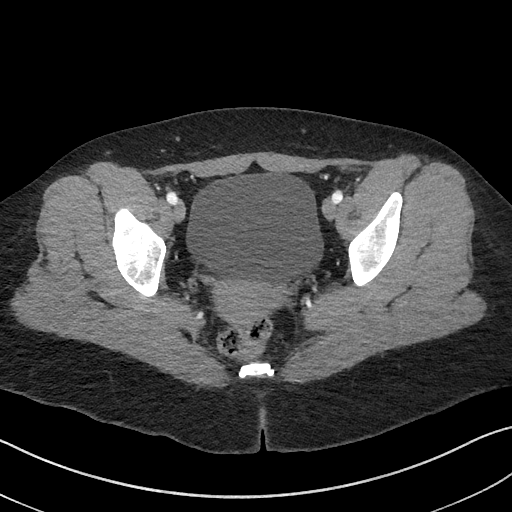
[im 68/306  soft-tissue]
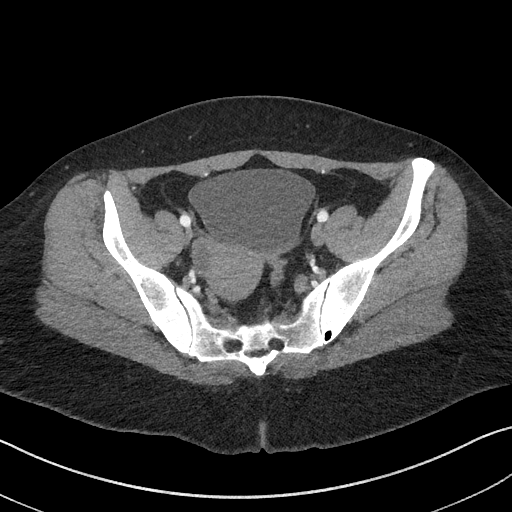
[im 102/306  soft-tissue]
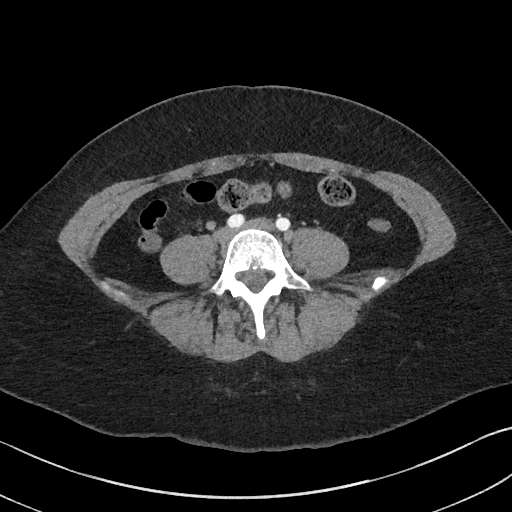
[im 119/306  soft-tissue]
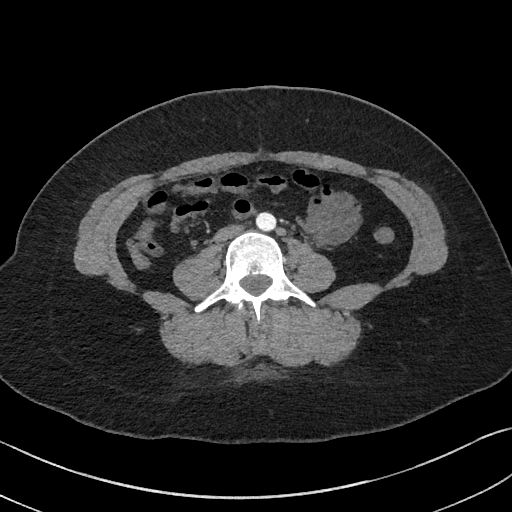
[im 153/306  soft-tissue]
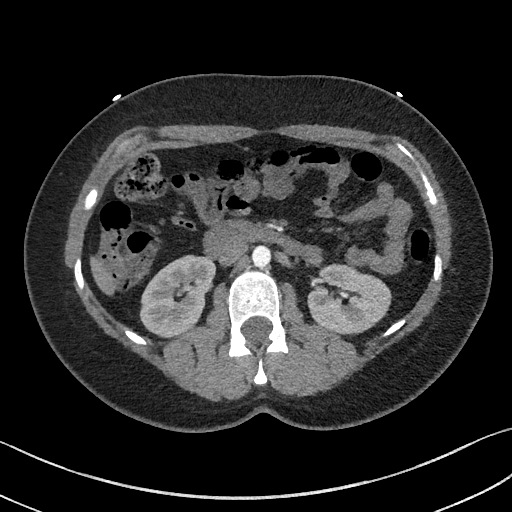
[im 187/306  soft-tissue]
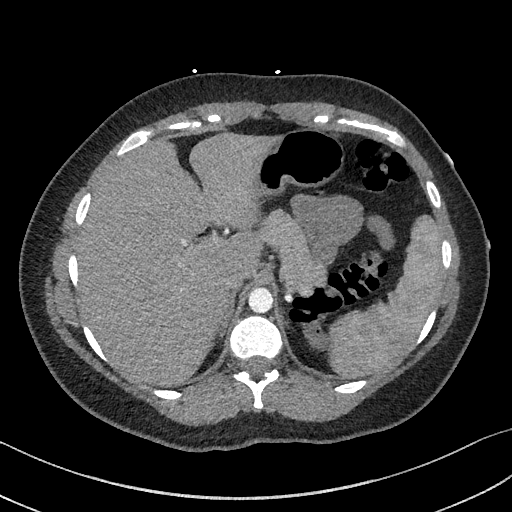
[im 204/306  soft-tissue]
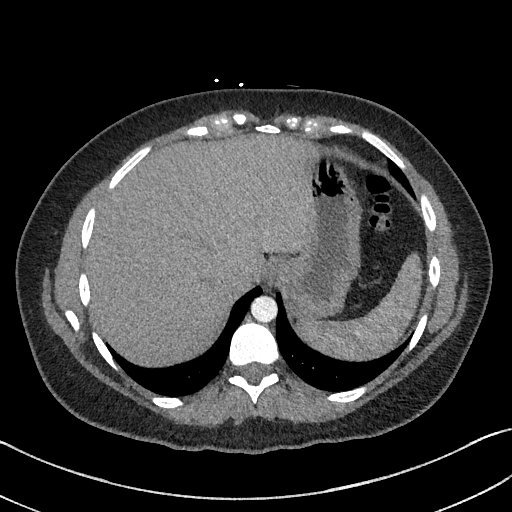
[im 238/306  soft-tissue]
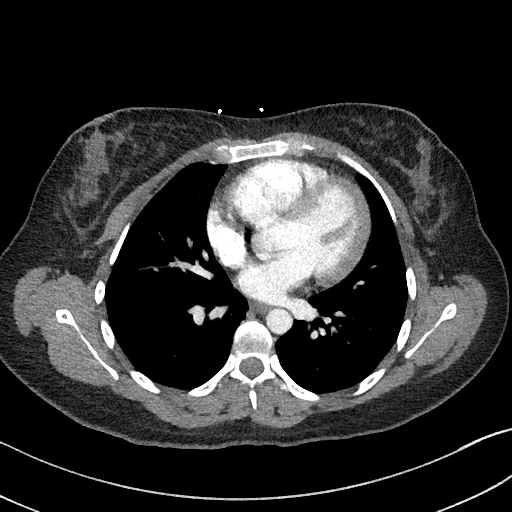
[im 238/306  bone]
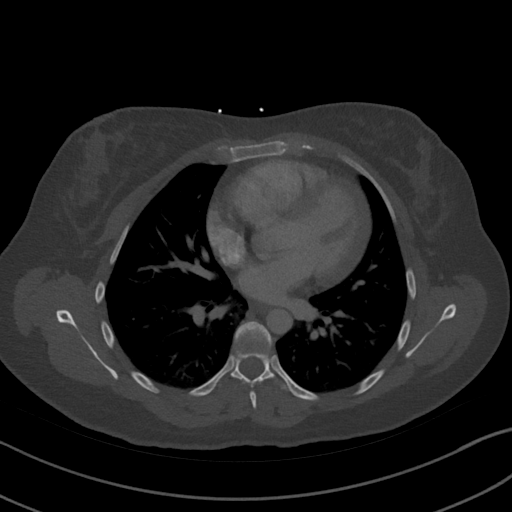
[im 255/306  soft-tissue]
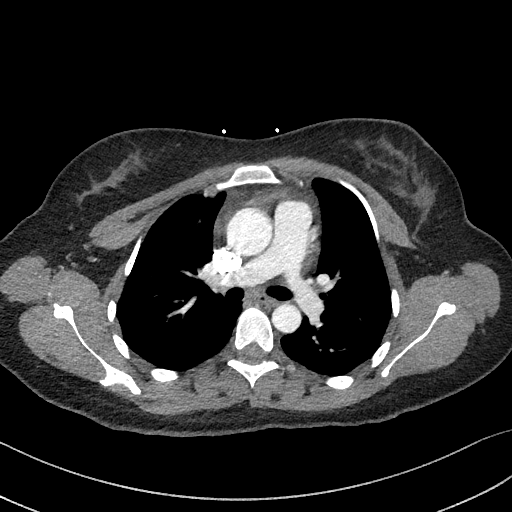
[im 289/306  soft-tissue]
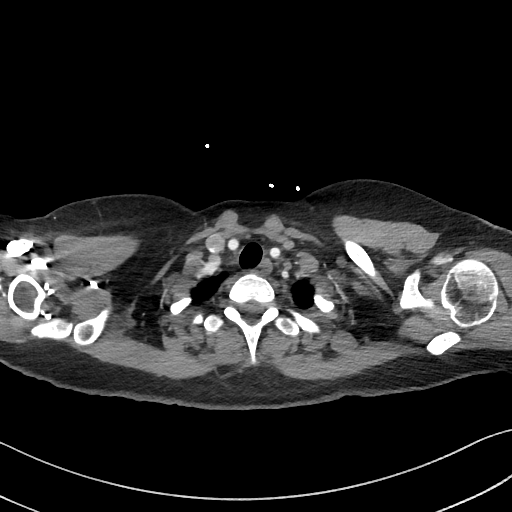

[Series 10: dissection 2mm cor · coronal · 0.83mm/px · 3 of 145 slices shown]
[im 37/145  soft-tissue]
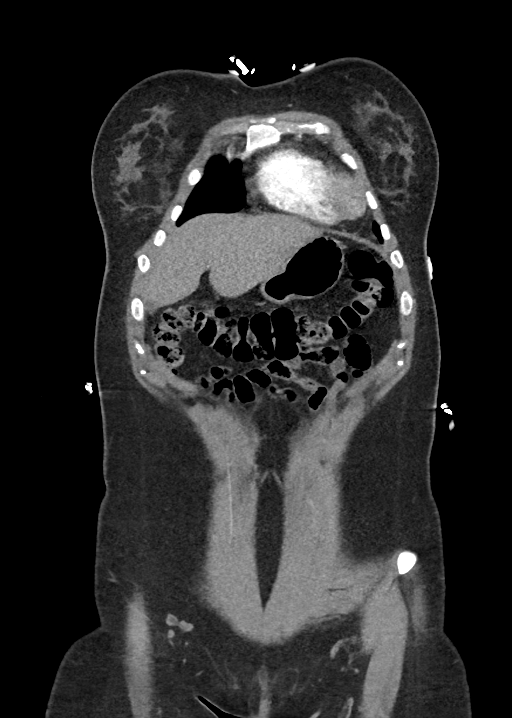
[im 73/145  soft-tissue]
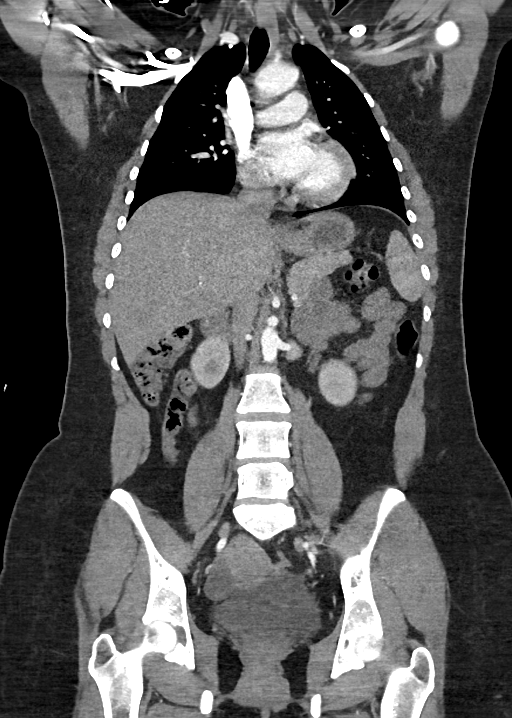
[im 109/145  soft-tissue]
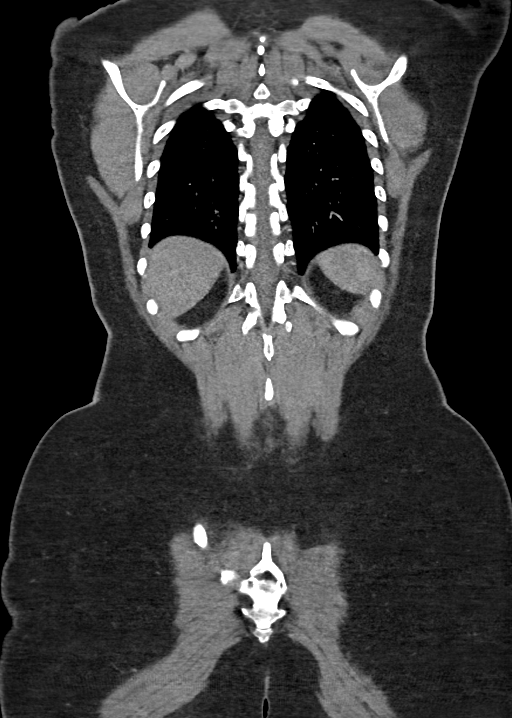

[14 of 46 positions shown; findings below may reference images not displayed]

FINDINGS: CTA CHEST FINDINGS

Cardiovascular: No abnormal aortic wall thickening or features to
suggest intramural hematoma. Satisfactory preferential opacification
of the aorta The aorta is normal caliber. No intramural hematoma,
dissection flap or other luminal abnormality of the aorta is seen.
No periaortic stranding or hemorrhage. Normal branching of the great
vessels.

Normal heart size. No pericardial effusion.

Limited evaluation of the central pulmonary arteries reveals no
central or large proximal segmental pulmonary embolus. Normal
pulmonary artery caliber.

Mediastinum/Nodes: No enlarged mediastinal or axillary lymph nodes.
Thyroid gland, trachea, and esophagus demonstrate no significant
findings.

Lungs/Pleura: No consolidation, features of edema, pneumothorax, or
effusion.

Musculoskeletal: No chest wall abnormality. No acute or significant
osseous findings.

Review of the MIP images confirms the above findings.

CTA ABDOMEN AND PELVIS FINDINGS

VASCULAR

Aorta: Normal caliber aorta without aneurysm, dissection, vasculitis
or significant stenosis.

Celiac: Patent without evidence of aneurysm, dissection, vasculitis
or significant stenosis.

SMA: Patent without evidence of aneurysm, dissection, vasculitis or
significant stenosis.

Renals: Singleton renal arteries bilaterally. Both renal arteries
are patent without evidence of aneurysm, dissection, vasculitis,
fibromuscular dysplasia or significant stenosis.

IMA: Patent without evidence of aneurysm, dissection, vasculitis or
significant stenosis.

Inflow: Patent without evidence of aneurysm, dissection, vasculitis
or significant stenosis.

Veins: No obvious venous abnormality within the limitations of this
arterial phase study.

Review of the MIP images confirms the above findings.

NON-VASCULAR

Hepatobiliary: No focal liver abnormality is seen. No gallstones,
gallbladder wall thickening, or biliary dilatation.

Pancreas: Unremarkable. No pancreatic ductal dilatation or
surrounding inflammatory changes.

Spleen: Normal in size without focal abnormality.

Adrenals/Urinary Tract: Adrenal glands are unremarkable. Early
excretion of contrast medium partially opacifies the collecting
systems. Kidneys are otherwise unremarkable, without renal calculi,
focal lesion, or hydronephrosis. Bladder is unremarkable.

Stomach/Bowel: Distal esophagus, stomach and duodenal sweep are
unremarkable. No bowel wall thickening or dilatation. No evidence of
obstruction. A normal appendix is visualized.

Lymphatic: No suspicious or enlarged lymph nodes in the included
lymphatic chains.

Reproductive: Normal anteverted uterus. Dominant 3.2 cm follicle in
the right ovary. No concerning adnexal lesions.

Other: No abdominopelvic free fluid or free gas. No bowel containing
hernias.

Musculoskeletal: Subchondral vacuum phenomenon involving the left
ilium. Bilateral, symmetric sclerosis involving the iliac sides of
both SI joints compatible with osteitis condensans ilii. No acute
osseous abnormality or suspicious osseous lesion. Benign bone island
in the proximal right femur.

Review of the MIP images confirms the above findings.
IMPRESSION: No evidence of acute aortic syndrome. No acute intrathoracic or
intra-abdominal process.

## 2020-08-21 ENCOUNTER — Telehealth: Payer: Self-pay | Admitting: Cardiovascular Disease

## 2020-08-21 NOTE — Telephone Encounter (Signed)
New Message:     Pt would like a letter to be exempt from taking the COVID 19 Vaccine. She  Says he need this because of her heart condition.

## 2020-08-21 NOTE — Telephone Encounter (Signed)
Spoke with the patient who states that her work is not currently requiring her to get the vaccination at this time but she is concerned that they will. She would like an exemption letter for getting the vaccine if they do. I advised her that we are advising all of our cardiac patients to receive the vaccination and have not been giving exemptions. Patient verbalized understanding.

## 2021-06-12 ENCOUNTER — Ambulatory Visit
Admission: RE | Admit: 2021-06-12 | Discharge: 2021-06-12 | Disposition: A | Discharge status: Home / Self Care | Payer: BC Managed Care – PPO | Source: Ambulatory Visit | Attending: Emergency Medicine | Admitting: Emergency Medicine

## 2021-06-12 ENCOUNTER — Other Ambulatory Visit: Payer: Self-pay

## 2021-06-12 VITALS — BP 155/103 | HR 103 | Temp 100.5°F | Resp 18

## 2021-06-12 DIAGNOSIS — M545 Low back pain, unspecified: Secondary | ICD-10-CM | POA: Diagnosis not present

## 2021-06-12 DIAGNOSIS — B349 Viral infection, unspecified: Secondary | ICD-10-CM

## 2021-06-12 NOTE — Discharge Instructions (Addendum)
Your COVID and Flu test is pending.  You should self quarantine until the test result is back.    Take Tylenol or ibuprofen as needed for fever or discomfort.  Rest and keep yourself hydrated.    Follow-up with your primary care provider if your symptoms are not improving.

## 2021-06-12 NOTE — ED Provider Notes (Signed)
[facility]    CSN: [id number] Arrival date & time: 06/12/21  1013      History   Chief Complaint Chief Complaint  Patient presents with   Chills   Generalized Body Aches   Fever   Sore Throat    HPI Andrea Austin is a 30 y.o. female.  Patient presents with 1 day history of fever, chills, body aches, sore throat, low back pain.  T-max 100.3.  The back pain is non-radiating.  No falls or known injury.  Treatment attempted at home with ibuprofen, Zyrtec, vitamin C.  She denies rash, nasal congestion, cough, shortness of breath, vomiting, diarrhea, dysuria, hematuria, numbness, weakness, paresthesias, saddle anesthesia, loss of bowel/bladder control, or other symptoms.  Patient states she is concerned about mold in her new house in [place] that she was cleaning; she has not had any cough or shortness of breath.  She also states that she ate some ice cream that "tasted funny" and she is concerned about Listeria; she has not had any vomiting or diarrhea.  Her medical history includes hypertension and myocarditis.  The history is provided by the patient and medical records.   Past Medical History:  Diagnosis Date   Elevated troponin 07/26/2019   Family history of adverse reaction to anesthesia    " HARD TIME WAKING MY MOM"    Patient Active Problem List   Diagnosis Date Noted   Chest pain 07/26/2019   Elevated troponin 07/26/2019    Past Surgical History:  Procedure Laterality Date   CARDIAC CATHETERIZATION  07/26/2019   LEFT HEART CATH AND CORONARY ANGIOGRAPHY N/A 07/26/2019   Procedure: LEFT HEART CATH AND CORONARY ANGIOGRAPHY;  Surgeon: [place], Peter M, MD;  Location: [facility] INVASIVE CV LAB;  Service: Cardiovascular;  Laterality: N/A;   WISDOM TOOTH EXTRACTION      OB History   No obstetric history on file.      Home Medications    Prior to Admission medications   Medication Sig Start Date End Date Taking? Authorizing Provider  aspirin EC 81 MG EC tablet  Take 1 tablet (81 mg total) by mouth daily. 07/27/19   Glade Lloyd, MD  cetirizine (ZYRTEC) 10 MG tablet Take 10 mg by mouth every evening.     [provider]  ibuprofen (ADVIL) 600 MG tablet Take 1 tablet (600 mg total) by mouth every 6 (six) hours as needed. 11/15/19   Jacalyn Lefevre, MD  nitroGLYCERIN (NITROSTAT) 0.4 MG SL tablet Place 1 tablet (0.4 mg total) under the tongue every 5 (five) minutes as needed for chest pain. 07/26/19   Glade Lloyd, MD  propranolol (INDERAL) 10 MG tablet TAKE 1 TABLET (10 MG TOTAL) BY MOUTH 4 (FOUR) TIMES DAILY AS NEEDED. 02/28/20   Nahser, Deloris Ping, MD  SPRINTEC 28 0.25-35 MG-MCG tablet Take 1 tablet by mouth daily. 06/07/19   [provider]    Family History Family History  Problem Relation Age of Onset   CAD Maternal Grandmother    Stroke Maternal Grandmother    Diabetes Mellitus II Maternal Grandfather     Social History Social History   Tobacco Use   Smoking status: Never   Smokeless tobacco: Never  Vaping Use   Vaping Use: Never used  Substance Use Topics   Alcohol use: Yes    Comment: RARE   Drug use: Never     Allergies   Ceclor [cefaclor] and Levofloxacin   Review of Systems Review of Systems  Constitutional:  Positive  for chills and fever.  HENT:  Positive for sore throat. Negative for ear pain.   Respiratory:  Negative for cough and shortness of breath.   Cardiovascular:  Negative for chest pain and palpitations.  Gastrointestinal:  Negative for abdominal pain, diarrhea and vomiting.  Genitourinary:  Negative for dysuria, flank pain, hematuria, pelvic pain and vaginal discharge.  Musculoskeletal:  Positive for back pain. Negative for arthralgias and gait problem.  Skin:  Negative for color change and rash.  All other systems reviewed and are negative.   Physical Exam Triage Vital Signs ED Triage Vitals  Enc Vitals Group     BP      Pulse      Resp      Temp      Temp src      SpO2      Weight       Height      Head Circumference      Peak Flow      Pain Score      Pain Loc      Pain Edu?      Excl. in GC?    No data found.  Updated Vital Signs BP (!) 155/103 (BP Location: Left Arm)   Pulse (!) 103   Temp (S) (!) 100.5 F (38.1 C) (Oral) Comment: last dose of motrin 0700  Resp 18   LMP 06/09/2021 (Approximate)   SpO2 99%   Visual Acuity Right Eye Distance:   Left Eye Distance:   Bilateral Distance:    Right Eye Near:   Left Eye Near:    Bilateral Near:     Physical Exam Vitals and nursing note reviewed.  Constitutional:      General: She is not in acute distress.    Appearance: She is well-developed. She is not ill-appearing.  HENT:     Head: Normocephalic and atraumatic.     Right Ear: Tympanic membrane normal.     Left Ear: Tympanic membrane normal.     Nose: Nose normal.     Mouth/Throat:     Mouth: Mucous membranes are moist.     Pharynx: Oropharynx is clear.  Eyes:     Conjunctiva/sclera: Conjunctivae normal.  Cardiovascular:     Rate and Rhythm: Normal rate and regular rhythm.     Heart sounds: Normal heart sounds.  Pulmonary:     Effort: Pulmonary effort is normal. No respiratory distress.     Breath sounds: Normal breath sounds.  Abdominal:     General: Bowel sounds are normal.     Palpations: Abdomen is soft.     Tenderness: There is no abdominal tenderness. There is no right CVA tenderness, left CVA tenderness, guarding or rebound.  Musculoskeletal:        General: No swelling, tenderness, deformity or signs of injury. Normal range of motion.     Cervical back: Neck supple.  Skin:    General: Skin is warm and dry.     Findings: No bruising, erythema, lesion or rash.  Neurological:     General: No focal deficit present.     Mental Status: She is alert and oriented to person, place, and time.     Gait: Gait normal.  Psychiatric:        Mood and Affect: Mood normal.        Behavior: Behavior normal.     UC Treatments / Results   Labs (all labs ordered are listed, but only abnormal results are displayed) Labs  Reviewed  COVID-19, FLU A+B NAA    EKG   Radiology No results found.  Procedures Procedures (including critical care time)  Medications Ordered in UC Medications - No data to display  Initial Impression / Assessment and Plan / UC Course  I have reviewed the triage vital signs and the nursing notes.  Pertinent labs & imaging results that were available during my care of the patient were reviewed by me and considered in my medical decision making (see chart for details).  Viral illness, acute low back pain without sciatica.  Patient initially requested a chest x-ray due to her concern for mold in her new house in [place] that she has been cleaning.  I discussed with her that her lungs are clear, O2 sat is 99% on room air, she has no cough or shortness of breath; therefore no chest x-ray indicated.  I also discussed her concern for Listeria due to eating ice cream that she thought tasted bad 2 days ago; she has not had any vomiting or diarrhea.  I also discussed her low back pain; she has no urinary symptoms; no falls or trauma; she recently drove down to [place] and back; I discussed that her back pain may be due to the long trip or cleaning her new home in [place].  I instructed patient to take Tylenol or ibuprofen as needed for fever or discomfort.  Nasal swab obtained for COVID and flu tests.  Instructed patient to self quarantine until the test results are back.  Instructed her to rest and stay hydrated.  Instructed her to follow-up with her PCP if her symptoms are not improving.  She agrees to plan of care.   Final Clinical Impressions(s) / UC Diagnoses   Final diagnoses:  Viral illness  Acute bilateral low back pain without sciatica     Discharge Instructions      Your COVID and Flu test is pending.  You should self quarantine until the test result is back.    Take Tylenol or ibuprofen as  needed for fever or discomfort.  Rest and keep yourself hydrated.    Follow-up with your primary care provider if your symptoms are not improving.         ED Prescriptions   None    PDMP not reviewed this encounter.   Mickie Bail, NP 06/12/21 [phone]

## 2021-06-12 NOTE — ED Triage Notes (Signed)
Patient presents to Urgent Care with complaints of chills, lower back pain, body aches, fever (100.3), sore throat since yesterday.  Has a hx of seasonal allergies. Treating symptoms with vitamin C, zyrtec, motrin.  Denies abdominal pain, vomiting, and diarrhea.

## 2021-06-14 LAB — COVID-19, FLU A+B NAA
Influenza A, NAA: NOT DETECTED
Influenza B, NAA: NOT DETECTED
SARS-CoV-2, NAA: DETECTED — AB

## 2021-06-17 ENCOUNTER — Ambulatory Visit: Payer: BC Managed Care – PPO

## 2021-07-03 ENCOUNTER — Other Ambulatory Visit (HOSPITAL_COMMUNITY)
Admission: RE | Admit: 2021-07-03 | Discharge: 2021-07-03 | Disposition: A | Discharge status: Home / Self Care | Payer: BC Managed Care – PPO | Source: Ambulatory Visit | Attending: Physician Assistant | Admitting: Physician Assistant

## 2021-07-03 DIAGNOSIS — Z0000 Encounter for general adult medical examination without abnormal findings: Secondary | ICD-10-CM | POA: Insufficient documentation

## 2021-07-05 LAB — CYTOLOGY - PAP: Diagnosis: NEGATIVE
